# Patient Record
Sex: Male | Born: 1950
Health system: Southern US, Community
[De-identification: ages and names within clinical notes are randomized; demographics above are authoritative.]

## PROBLEM LIST (undated history)

## (undated) DIAGNOSIS — T8859XA Other complications of anesthesia, initial encounter: Secondary | ICD-10-CM

## (undated) DIAGNOSIS — K219 Gastro-esophageal reflux disease without esophagitis: Secondary | ICD-10-CM

## (undated) DIAGNOSIS — E785 Hyperlipidemia, unspecified: Secondary | ICD-10-CM

## (undated) DIAGNOSIS — E119 Type 2 diabetes mellitus without complications: Secondary | ICD-10-CM

## (undated) DIAGNOSIS — J302 Other seasonal allergic rhinitis: Secondary | ICD-10-CM

## (undated) DIAGNOSIS — Z8739 Personal history of other diseases of the musculoskeletal system and connective tissue: Secondary | ICD-10-CM

## (undated) DIAGNOSIS — N400 Enlarged prostate without lower urinary tract symptoms: Secondary | ICD-10-CM

## (undated) DIAGNOSIS — M1990 Unspecified osteoarthritis, unspecified site: Secondary | ICD-10-CM

## (undated) DIAGNOSIS — I480 Paroxysmal atrial fibrillation: Secondary | ICD-10-CM

## (undated) DIAGNOSIS — F419 Anxiety disorder, unspecified: Secondary | ICD-10-CM

## (undated) DIAGNOSIS — G4730 Sleep apnea, unspecified: Secondary | ICD-10-CM

## (undated) DIAGNOSIS — T4145XA Adverse effect of unspecified anesthetic, initial encounter: Secondary | ICD-10-CM

## (undated) DIAGNOSIS — I499 Cardiac arrhythmia, unspecified: Secondary | ICD-10-CM

## (undated) HISTORY — DX: Benign prostatic hyperplasia without lower urinary tract symptoms: N40.0

## (undated) HISTORY — DX: Paroxysmal atrial fibrillation: I48.0

## (undated) HISTORY — DX: Hyperlipidemia, unspecified: E78.5

## (undated) HISTORY — PX: KNEE ARTHROSCOPY: SUR90

---

## 2003-02-18 ENCOUNTER — Ambulatory Visit (HOSPITAL_COMMUNITY): Admission: RE | Admit: 2003-02-18 | Discharge: 2003-02-18 | Payer: Self-pay | Admitting: Internal Medicine

## 2005-03-30 ENCOUNTER — Ambulatory Visit: Payer: Self-pay | Admitting: Orthopedic Surgery

## 2013-02-17 ENCOUNTER — Telehealth: Payer: Self-pay

## 2013-02-17 NOTE — Telephone Encounter (Signed)
Pt is wanting to get set up for his TCS on Oct.27. He is going to call back tomorrow after 3:30

## 2013-02-19 ENCOUNTER — Other Ambulatory Visit: Payer: Self-pay

## 2013-02-19 DIAGNOSIS — Z1211 Encounter for screening for malignant neoplasm of colon: Secondary | ICD-10-CM

## 2013-02-25 NOTE — Telephone Encounter (Signed)
Gastroenterology Pre-Procedure Review  Request Date: 02/20/2013 Requesting Physician: Received a letter time to schedule next/ Last TCS with RMR 02/18/2003  PATIENT REVIEW QUESTIONS: The patient responded to the following health history questions as indicated:    1. Diabetes Melitis: no 2. Joint replacements in the past 12 months: no 3. Major health problems in the past 3 months: no 4. Has an artificial valve or MVP: no 5. Has a defibrillator: no 6. Has been advised in past to take antibiotics in advance of a procedure like teeth cleaning: no    MEDICATIONS & ALLERGIES:    Patient reports the following regarding taking any blood thinners:   Plavix? no Aspirin? no Coumadin? no  Patient confirms/reports the following medications:  Current Outpatient Prescriptions  Medication Sig Dispense Refill  . acetaminophen (TYLENOL) 325 MG tablet Take 650 mg by mouth every 6 (six) hours as needed for pain. Only as needed      . NON FORMULARY Allergy pill    Only as needed       No current facility-administered medications for this visit.    Patient confirms/reports the following allergies:  Allergies  Allergen Reactions  . Codeine Rash    No orders of the defined types were placed in this encounter.    AUTHORIZATION INFORMATION Primary Insurance:   ID #:  Group #:  Pre-Cert / Auth required:  Pre-Cert / Auth #:   Secondary Insurance:   ID #:   Group #:  Pre-Cert / Auth :  Pre-Cert / Auth #:   SCHEDULE INFORMATION: Procedure has been scheduled as follows:  Date:04/07/2013        Time:  7:30 AM Location: [facility] Short Stay  This Gastroenterology Pre-Precedure Review Form is being routed to the following provider(s): R. Roetta Sessions, MD

## 2013-02-25 NOTE — Telephone Encounter (Signed)
Appropriate.

## 2013-02-26 MED ORDER — PEG-KCL-NACL-NASULF-NA ASC-C 100 G PO SOLR: 1.0000 | ORAL | Status: DC

## 2013-02-26 NOTE — Telephone Encounter (Signed)
Rx sent to the pharmacy and instructions mailed to pt.  

## 2013-03-21 ENCOUNTER — Encounter (HOSPITAL_COMMUNITY): Payer: Self-pay | Admitting: Pharmacy Technician

## 2013-04-07 ENCOUNTER — Encounter (HOSPITAL_COMMUNITY): Payer: Self-pay

## 2013-04-07 ENCOUNTER — Ambulatory Visit (HOSPITAL_COMMUNITY)
Admission: RE | Admit: 2013-04-07 | Discharge: 2013-04-07 | Disposition: A | Discharge status: Home / Self Care | Payer: BC Managed Care – PPO | Source: Ambulatory Visit | Attending: Internal Medicine | Admitting: Internal Medicine

## 2013-04-07 ENCOUNTER — Encounter (HOSPITAL_COMMUNITY)
Admission: RE | Disposition: A | Discharge status: Home / Self Care | Payer: Self-pay | Source: Ambulatory Visit | Attending: Internal Medicine

## 2013-04-07 DIAGNOSIS — D126 Benign neoplasm of colon, unspecified: Secondary | ICD-10-CM

## 2013-04-07 DIAGNOSIS — K620 Anal polyp: Secondary | ICD-10-CM

## 2013-04-07 DIAGNOSIS — Z1211 Encounter for screening for malignant neoplasm of colon: Secondary | ICD-10-CM

## 2013-04-07 DIAGNOSIS — K621 Rectal polyp: Secondary | ICD-10-CM

## 2013-04-07 DIAGNOSIS — D128 Benign neoplasm of rectum: Secondary | ICD-10-CM | POA: Insufficient documentation

## 2013-04-07 DIAGNOSIS — K5730 Diverticulosis of large intestine without perforation or abscess without bleeding: Secondary | ICD-10-CM | POA: Insufficient documentation

## 2013-04-07 HISTORY — DX: Adverse effect of unspecified anesthetic, initial encounter: T41.45XA

## 2013-04-07 HISTORY — DX: Other complications of anesthesia, initial encounter: T88.59XA

## 2013-04-07 HISTORY — PX: COLONOSCOPY: SHX5424

## 2013-04-07 HISTORY — DX: Personal history of other diseases of the musculoskeletal system and connective tissue: Z87.39

## 2013-04-07 SURGERY — COLONOSCOPY
Anesthesia: Moderate Sedation

## 2013-04-07 MED ORDER — MIDAZOLAM HCL 5 MG/5ML IJ SOLN
INTRAMUSCULAR | Status: DC | PRN
  Administered 2013-04-07 (×3): 1 mg via INTRAVENOUS
  Administered 2013-04-07: 2 mg via INTRAVENOUS

## 2013-04-07 MED ORDER — MEPERIDINE HCL 100 MG/ML IJ SOLN
INTRAMUSCULAR | Status: AC
  Filled 2013-04-07: qty 2

## 2013-04-07 MED ORDER — ONDANSETRON HCL 4 MG/2ML IJ SOLN
INTRAMUSCULAR | Status: AC
  Filled 2013-04-07: qty 2

## 2013-04-07 MED ORDER — MEPERIDINE HCL 100 MG/ML IJ SOLN
INTRAMUSCULAR | Status: DC | PRN
  Administered 2013-04-07: 25 mg via INTRAVENOUS
  Administered 2013-04-07: 50 mg via INTRAVENOUS
  Administered 2013-04-07: 25 mg via INTRAVENOUS

## 2013-04-07 MED ORDER — SODIUM CHLORIDE 0.9 % IV SOLN
INTRAVENOUS | Status: DC
  Administered 2013-04-07: 07:00:00 via INTRAVENOUS

## 2013-04-07 MED ORDER — SIMETHICONE 40 MG/0.6ML PO SUSP
ORAL | Status: AC
  Filled 2013-04-07: qty 1.2

## 2013-04-07 MED ORDER — STERILE WATER FOR IRRIGATION IR SOLN
Status: DC | PRN
  Administered 2013-04-07: 08:00:00

## 2013-04-07 MED ORDER — MIDAZOLAM HCL 5 MG/5ML IJ SOLN
INTRAMUSCULAR | Status: AC
  Filled 2013-04-07: qty 10

## 2013-04-07 MED ORDER — ONDANSETRON HCL 4 MG/2ML IJ SOLN
INTRAMUSCULAR | Status: DC | PRN
  Administered 2013-04-07: 4 mg via INTRAVENOUS

## 2013-04-07 NOTE — H&P (Signed)
  Primary Care Physician:  Selinda Flavin, MD Primary Gastroenterologist:  Dr. Jena Gauss  Pre-Procedure History & Physical: HPI:  Brian Leach is a 62 y.o. male is here for a screening colonoscopy. Negative colonoscopy 10 years ago. No bowel symptoms. No family history colon polyps or cancer.  Past Medical History  Diagnosis Date  . Complication of anesthesia     difficult intubation  . History of gout     Past Surgical History  Procedure Laterality Date  . Knee arthroscopy Left     Prior to Admission medications   Medication Sig Start Date End Date Taking? Authorizing Provider  OVER THE COUNTER MEDICATION Take 1 tablet by mouth daily as needed. OTC allergy medication   Yes Historical Provider, MD  ibuprofen (ADVIL,MOTRIN) 200 MG tablet Take 800 mg by mouth every 6 (six) hours as needed for pain.    Historical Provider, MD    Allergies as of 02/19/2013  . (Not on File)    No family history on file.  History   Social History  . Marital Status: Single    Spouse Name: N/A    Number of Children: N/A  . Years of Education: N/A   Occupational History  . Not on file.   Social History Main Topics  . Smoking status: Never Smoker   . Smokeless tobacco: Not on file  . Alcohol Use: No  . Drug Use: No  . Sexual Activity: Yes    Birth Control/ Protection: None   Other Topics Concern  . Not on file   Social History Narrative  . No narrative on file    Review of Systems: See HPI, otherwise negative ROS  Physical Exam: BP 130/87  Pulse 62  Temp(Src) 98.2 F (36.8 C) (Oral)  Resp 16  Ht 5' 7.5" (1.715 m)  Wt 212 lb (96.163 kg)  BMI 32.69 kg/m2  SpO2 98% General:   Alert,  Well-developed, well-nourished, pleasant and cooperative in NAD Head:  Normocephalic and atraumatic. Eyes:  Sclera clear, no icterus.   Conjunctiva pink. Ears:  Normal auditory acuity. Nose:  No deformity, discharge,  or lesions. Mouth:  No deformity or lesions, dentition normal. Neck:  Supple;  no masses or thyromegaly. Lungs:  Clear throughout to auscultation.   No wheezes, crackles, or rhonchi. No acute distress. Heart:  Regular rate and rhythm; no murmurs, clicks, rubs,  or gallops. Abdomen:  Soft, nontender and nondistended. No masses, hepatosplenomegaly or hernias noted. Normal bowel sounds, without guarding, and without rebound.   Msk:  Symmetrical without gross deformities. Normal posture. Pulses:  Normal pulses noted. Extremities:  Without clubbing or edema. Neurologic:  Alert and  oriented x4;  grossly normal neurologically.  Impression/Plan: [name] is now here to undergo a screening colonoscopy. Average risk risk screening examination.  Risks, benefits, limitations, imponderables and alternatives regarding colonoscopy have been reviewed with the patient. Questions have been answered. All parties agreeable.

## 2013-04-07 NOTE — Op Note (Signed)
[facility] [address] [place], [address]   COLONOSCOPY PROCEDURE REPORT  PATIENT: Brian Leach, Brian Leach.  MR#:         [id number] BIRTHDATE: Oct 27, 1950 , 62  yrs. old GENDER: Male ENDOSCOPIST: R.  Roetta Sessions, MD FACP FACG REFERRED BY:  Selinda Flavin, M.D. PROCEDURE DATE:  04/07/2013 PROCEDURE:     Colonoscopy with biopsy and snare polypectomy  INDICATIONS: Average risk colorectal cancer screening examination  INFORMED CONSENT:  The risks, benefits, alternatives and imponderables including but not limited to bleeding, perforation as well as the possibility of a missed lesion have been reviewed.  The potential for biopsy, lesion removal, etc. have also been discussed.  Questions have been answered.  All parties agreeable. Please see the history and physical in the medical record for more information.  MEDICATIONS: Versed 6 mg IV and Demerol 100 mg IV in divided doses. Zofran 4 mg  DESCRIPTION OF PROCEDURE:  After a digital rectal exam was performed, the EC-[id number] ([id number])  colonoscope was advanced from the anus through the rectum and colon to the area of the cecum, ileocecal valve and appendiceal orifice.  The cecum was deeply intubated.  These structures were well-seen and photographed for the record.  From the level of the cecum and ileocecal valve, the scope was slowly and cautiously withdrawn.  The mucosal surfaces were carefully surveyed utilizing scope tip deflection to facilitate fold flattening as needed.  The scope was pulled down into the rectum where a thorough examination including retroflexion was performed.    FINDINGS:  Adequate Preparation.  (1) 4 mm polyp in the rectum at 3 cm verge; otherwise, the remainder of the rectal mucosa appeared normal. Scattered sigmoid diverticula; (1) 3 mm polyp at the rectosigmoid junction. There was another 5 mm polyp at the splenic flexure. At the ileocecal valve, there was an 8 mm polyp. The remainder of  the colonic mucosa appeared normal.  THERAPEUTIC / DIAGNOSTIC MANEUVERS PERFORMED:  The above-mentioned polyps were hot snare removed - except for the rectosigmoid polyp which was cold biopsied/removed.;  COMPLICATIONS: None  CECAL WITHDRAWAL TIME:  IMPRESSION:  Colonic diverticulosis. Multiple colonic and rectal polyps-removed as described above  RECOMMENDATIONS: Followup on pathology.   _______________________________ eSigned:  R. Roetta Sessions, MD FACP [facility] 04/07/2013 8:42 AM   CC:    PATIENT NAME:  Issaac, Shipper. MR#: [id number]

## 2013-04-09 ENCOUNTER — Encounter: Payer: Self-pay | Admitting: Internal Medicine

## 2013-04-11 ENCOUNTER — Encounter (HOSPITAL_COMMUNITY): Payer: Self-pay | Admitting: Internal Medicine

## 2017-02-09 DIAGNOSIS — E7800 Pure hypercholesterolemia, unspecified: Secondary | ICD-10-CM | POA: Diagnosis not present

## 2017-02-09 DIAGNOSIS — M129 Arthropathy, unspecified: Secondary | ICD-10-CM | POA: Diagnosis not present

## 2017-02-09 DIAGNOSIS — R739 Hyperglycemia, unspecified: Secondary | ICD-10-CM | POA: Diagnosis not present

## 2017-02-14 DIAGNOSIS — Z6836 Body mass index (BMI) 36.0-36.9, adult: Secondary | ICD-10-CM | POA: Diagnosis not present

## 2017-02-14 DIAGNOSIS — Z23 Encounter for immunization: Secondary | ICD-10-CM | POA: Diagnosis not present

## 2017-02-14 DIAGNOSIS — Z0001 Encounter for general adult medical examination with abnormal findings: Secondary | ICD-10-CM | POA: Diagnosis not present

## 2017-02-14 DIAGNOSIS — M129 Arthropathy, unspecified: Secondary | ICD-10-CM | POA: Diagnosis not present

## 2017-02-14 DIAGNOSIS — M545 Low back pain: Secondary | ICD-10-CM | POA: Diagnosis not present

## 2017-06-25 DIAGNOSIS — Z6836 Body mass index (BMI) 36.0-36.9, adult: Secondary | ICD-10-CM | POA: Diagnosis not present

## 2017-06-25 DIAGNOSIS — J069 Acute upper respiratory infection, unspecified: Secondary | ICD-10-CM | POA: Diagnosis not present

## 2017-07-19 DIAGNOSIS — H1131 Conjunctival hemorrhage, right eye: Secondary | ICD-10-CM | POA: Diagnosis not present

## 2017-07-19 DIAGNOSIS — S0531XA Ocular laceration without prolapse or loss of intraocular tissue, right eye, initial encounter: Secondary | ICD-10-CM | POA: Diagnosis not present

## 2017-08-10 DIAGNOSIS — H524 Presbyopia: Secondary | ICD-10-CM | POA: Diagnosis not present

## 2017-08-20 DIAGNOSIS — R739 Hyperglycemia, unspecified: Secondary | ICD-10-CM | POA: Diagnosis not present

## 2017-08-20 DIAGNOSIS — Z6836 Body mass index (BMI) 36.0-36.9, adult: Secondary | ICD-10-CM | POA: Diagnosis not present

## 2017-08-20 DIAGNOSIS — E7800 Pure hypercholesterolemia, unspecified: Secondary | ICD-10-CM | POA: Diagnosis not present

## 2017-08-20 DIAGNOSIS — M545 Low back pain: Secondary | ICD-10-CM | POA: Diagnosis not present

## 2017-08-20 DIAGNOSIS — M129 Arthropathy, unspecified: Secondary | ICD-10-CM | POA: Diagnosis not present

## 2017-10-01 DIAGNOSIS — Z6835 Body mass index (BMI) 35.0-35.9, adult: Secondary | ICD-10-CM | POA: Diagnosis not present

## 2017-10-01 DIAGNOSIS — M7551 Bursitis of right shoulder: Secondary | ICD-10-CM | POA: Diagnosis not present

## 2017-10-08 DIAGNOSIS — R300 Dysuria: Secondary | ICD-10-CM | POA: Diagnosis not present

## 2017-10-08 DIAGNOSIS — Z6835 Body mass index (BMI) 35.0-35.9, adult: Secondary | ICD-10-CM | POA: Diagnosis not present

## 2017-10-08 DIAGNOSIS — M7551 Bursitis of right shoulder: Secondary | ICD-10-CM | POA: Diagnosis not present

## 2017-10-15 DIAGNOSIS — Z6836 Body mass index (BMI) 36.0-36.9, adult: Secondary | ICD-10-CM | POA: Diagnosis not present

## 2017-10-15 DIAGNOSIS — S80862A Insect bite (nonvenomous), left lower leg, initial encounter: Secondary | ICD-10-CM | POA: Diagnosis not present

## 2017-10-29 DIAGNOSIS — R300 Dysuria: Secondary | ICD-10-CM | POA: Diagnosis not present

## 2017-10-29 DIAGNOSIS — Z1331 Encounter for screening for depression: Secondary | ICD-10-CM | POA: Diagnosis not present

## 2017-10-29 DIAGNOSIS — Z1389 Encounter for screening for other disorder: Secondary | ICD-10-CM | POA: Diagnosis not present

## 2017-10-29 DIAGNOSIS — M7551 Bursitis of right shoulder: Secondary | ICD-10-CM | POA: Diagnosis not present

## 2017-10-29 DIAGNOSIS — S76211A Strain of adductor muscle, fascia and tendon of right thigh, initial encounter: Secondary | ICD-10-CM | POA: Diagnosis not present

## 2017-10-29 DIAGNOSIS — Z6836 Body mass index (BMI) 36.0-36.9, adult: Secondary | ICD-10-CM | POA: Diagnosis not present

## 2017-11-20 DIAGNOSIS — M7551 Bursitis of right shoulder: Secondary | ICD-10-CM | POA: Diagnosis not present

## 2017-11-20 DIAGNOSIS — Z6837 Body mass index (BMI) 37.0-37.9, adult: Secondary | ICD-10-CM | POA: Diagnosis not present

## 2017-11-20 DIAGNOSIS — M7521 Bicipital tendinitis, right shoulder: Secondary | ICD-10-CM | POA: Diagnosis not present

## 2017-12-05 DIAGNOSIS — M7521 Bicipital tendinitis, right shoulder: Secondary | ICD-10-CM | POA: Diagnosis not present

## 2017-12-05 DIAGNOSIS — Z6837 Body mass index (BMI) 37.0-37.9, adult: Secondary | ICD-10-CM | POA: Diagnosis not present

## 2017-12-05 DIAGNOSIS — M7551 Bursitis of right shoulder: Secondary | ICD-10-CM | POA: Diagnosis not present

## 2018-01-30 ENCOUNTER — Encounter: Payer: Self-pay | Admitting: Internal Medicine

## 2018-02-15 DIAGNOSIS — N400 Enlarged prostate without lower urinary tract symptoms: Secondary | ICD-10-CM | POA: Diagnosis not present

## 2018-02-15 DIAGNOSIS — R5383 Other fatigue: Secondary | ICD-10-CM | POA: Diagnosis not present

## 2018-02-15 DIAGNOSIS — R739 Hyperglycemia, unspecified: Secondary | ICD-10-CM | POA: Diagnosis not present

## 2018-02-15 DIAGNOSIS — E7800 Pure hypercholesterolemia, unspecified: Secondary | ICD-10-CM | POA: Diagnosis not present

## 2018-02-18 DIAGNOSIS — Z6836 Body mass index (BMI) 36.0-36.9, adult: Secondary | ICD-10-CM | POA: Diagnosis not present

## 2018-02-18 DIAGNOSIS — Z23 Encounter for immunization: Secondary | ICD-10-CM | POA: Diagnosis not present

## 2018-02-18 DIAGNOSIS — Z0000 Encounter for general adult medical examination without abnormal findings: Secondary | ICD-10-CM | POA: Diagnosis not present

## 2018-03-12 ENCOUNTER — Ambulatory Visit (INDEPENDENT_AMBULATORY_CARE_PROVIDER_SITE_OTHER): Payer: Self-pay

## 2018-03-12 ENCOUNTER — Ambulatory Visit: Payer: Self-pay

## 2018-03-12 DIAGNOSIS — Z86010 Personal history of colonic polyps: Secondary | ICD-10-CM

## 2018-03-12 MED ORDER — NA SULFATE-K SULFATE-MG SULF 17.5-3.13-1.6 GM/177ML PO SOLN: 1.0000 | ORAL | 0 refills | Status: DC

## 2018-03-12 NOTE — Progress Notes (Signed)
Gastroenterology Pre-Procedure Review  Request Date:03/12/18 Requesting Physician: 5 year recall- last tcs 03/13/18 RMR- tubular adenoma  PATIENT REVIEW QUESTIONS: The patient responded to the following health history questions as indicated:    1. Diabetes Melitis: no 2. Joint replacements in the past 12 months: no 3. Major health problems in the past 3 months: no 4. Has an artificial valve or MVP: no 5. Has a defibrillator: no 6. Has been advised in past to take antibiotics in advance of a procedure like teeth cleaning: no 7. Family history of colon cancer: no  8. Alcohol Use: no 9. History of sleep apnea: no  10. History of coronary artery or other vascular stents placed within the last 12 months: no 11. History of any prior anesthesia complications: no    MEDICATIONS & ALLERGIES:    Patient reports the following regarding taking any blood thinners:   Plavix? no Aspirin? no Coumadin? no Brilinta? no Xarelto? no Eliquis? no Pradaxa? no Savaysa? no Effient? no  Patient confirms/reports the following medications:  Current Outpatient Medications  Medication Sig Dispense Refill  . ibuprofen (ADVIL,MOTRIN) 800 MG tablet Take 800 mg by mouth every 6 (six) hours as needed.   0   No current facility-administered medications for this visit.     Patient confirms/reports the following allergies:  Allergies  Allergen Reactions  . Codeine Rash    No orders of the defined types were placed in this encounter.   AUTHORIZATION INFORMATION Primary Insurance:[employer] ,  ID #: [id number] Pre-Cert / Josem Kaufmann required: no  SCHEDULE INFORMATION: Procedure has been scheduled as follows:  Date: 05/14/18, Time:10:30 Location: [facility] Dr. Gala Romney  This Gastroenterology Pre-Precedure Review Form is being routed to the following provider(s): Walden Field NP

## 2018-03-12 NOTE — Patient Instructions (Signed)
Brian Leach  1950/12/30 MRN: [id number]     Procedure Date: 05/14/18 Time to register: 9:30am Place to register: [facility] Procedure Time: 10:30am Scheduled provider: R. Garfield Cornea, MD    PREPARATION FOR COLONOSCOPY WITH SUPREP BOWEL PREP KIT  Note: Suprep Bowel Prep Kit is a split-dose (2day) regimen. Consumption of BOTH 6-ounce bottles is required for a complete prep.  Please notify us immediately if you are diabetic, take iron supplements, or if you are on Coumadin or any other blood thinners.                                                                                                                                                 2 DAYS BEFORE PROCEDURE:  DATE: 05/12/18   DAY: Sunday Begin clear liquid diet AFTER your lunch meal. NO SOLID FOODS after this point.  1 DAY BEFORE PROCEDURE:  DATE: 05/13/18   DAY: Monday Continue clear liquids the entire day - NO SOLID FOOD.    At 6:00pm: Complete steps 1 through 4 below, using ONE (1) 6-ounce bottle, before going to bed. Step 1:  Pour ONE (1) 6-ounce bottle of SUPREP liquid into the mixing container.  Step 2:  Add cool drinking water to the 16 ounce line on the container and mix.  Note: Dilute the solution concentrate as directed prior to use. Step 3:  DRINK ALL the liquid in the container. Step 4:  You MUST drink an additional two (2) or more 16 ounce containers of water over the next one (1) hour.   Continue clear liquids.  DAY OF PROCEDURE:   DATE: 05/14/18   DAY: Tuesday If you take medications for your heart, blood pressure, or breathing, you may take these medications.    5 hours before your procedure at :5:30am Step 1:  Pour ONE (1) 6-ounce bottle of SUPREP liquid into the mixing container.  Step 2:  Add cool drinking water to the 16 ounce line on the container and mix.  Note: Dilute the solution concentrate as directed prior to use. Step 3:  DRINK ALL the liquid in the container. Step 4:  You MUST  drink an additional two (2) or more 16 ounce containers of water over the next one (1) hour. You MUST complete the final glass of water at least 3 hours before your colonoscopy.   Nothing by mouth past 7:30am  You may take your morning medications with sip of water unless we have instructed otherwise.    Please see below for Dietary Information.  CLEAR LIQUIDS INCLUDE:  Water Jello (NOT red in color)   Ice Popsicles (NOT red in color)   Tea (sugar ok, no milk/cream) Powdered fruit flavored drinks  Coffee (sugar ok, no milk/cream) Gatorade/ Lemonade/ Kool-Aid  (NOT red in color)   Juice: apple, white grape, white cranberry Soft drinks  Clear bullion, consomme, broth (fat free beef/chicken/vegetable)  Carbonated beverages (any kind)  Strained chicken noodle soup Hard Candy   Remember: Clear liquids are liquids that will allow you to see your fingers on the other side of a clear glass. Be sure liquids are NOT red in color, and not cloudy, but CLEAR.  DO NOT EAT OR DRINK ANY OF THE FOLLOWING:  Dairy products of any kind   Cranberry juice Tomato juice / V8 juice   Grapefruit juice Orange juice     Red grape juice  Do not eat any solid foods, including such foods as: cereal, oatmeal, yogurt, fruits, vegetables, creamed soups, eggs, bread, crackers, pureed foods in a blender, etc.   HELPFUL HINTS FOR DRINKING PREP SOLUTION:   Make sure prep is extremely cold. Mix and refrigerate the the morning of the prep. You may also put in the freezer.   You may try mixing some Crystal Light or Country Time Lemonade if you prefer. Mix in small amounts; add more if necessary.  Try drinking through a straw  Rinse mouth with water or a mouthwash between glasses, to remove after-taste.  Try sipping on a cold beverage /ice/ popsicles between glasses of prep.  Place a piece of sugar-free hard candy in mouth between glasses.  If you become nauseated, try consuming smaller amounts, or stretch out the  time between glasses. Stop for 30-60 minutes, then slowly start back drinking.     OTHER INSTRUCTIONS  You will need a responsible adult at least 66 years of age to accompany you and drive you home. This person must remain in the waiting room during your procedure. The hospital will cancel your procedure if you do not have a responsible adult with you.   1. Wear loose fitting clothing that is easily removed. 2. Leave jewelry and other valuables at home.  3. Remove all body piercing jewelry and leave at home. 4. Total time from sign-in until discharge is approximately 2-3 hours. 5. You should go home directly after your procedure and rest. You can resume normal activities the day after your procedure. 6. The day of your procedure you should not:  Drive  Make legal decisions  Operate machinery  Drink alcohol  Return to work   You may call the office (Dept: [phone]) before 5:00pm, or page the doctor on call [phone]) after 5:00pm, for further instructions, if necessary.   Insurance Information YOU WILL NEED TO CHECK WITH YOUR INSURANCE COMPANY FOR THE BENEFITS OF COVERAGE YOU HAVE FOR THIS PROCEDURE.  UNFORTUNATELY, NOT ALL INSURANCE COMPANIES HAVE BENEFITS TO COVER ALL OR PART OF THESE TYPES OF PROCEDURES.  IT IS YOUR RESPONSIBILITY TO CHECK YOUR BENEFITS, HOWEVER, WE WILL BE GLAD TO ASSIST YOU WITH ANY CODES YOUR INSURANCE COMPANY MAY NEED.    PLEASE NOTE THAT MOST INSURANCE COMPANIES WILL NOT COVER A SCREENING COLONOSCOPY FOR PEOPLE UNDER THE AGE OF 50  IF YOU HAVE BCBS INSURANCE, YOU MAY HAVE BENEFITS FOR A SCREENING COLONOSCOPY BUT IF POLYPS ARE FOUND THE DIAGNOSIS WILL CHANGE AND THEN YOU MAY HAVE A DEDUCTIBLE THAT WILL NEED TO BE MET. SO PLEASE MAKE SURE YOU CHECK YOUR BENEFITS FOR A SCREENING COLONOSCOPY AS WELL AS A DIAGNOSTIC COLONOSCOPY.

## 2018-03-15 NOTE — Progress Notes (Signed)
Ok to schedule.

## 2018-03-18 DIAGNOSIS — M779 Enthesopathy, unspecified: Secondary | ICD-10-CM | POA: Diagnosis not present

## 2018-03-18 DIAGNOSIS — Z6836 Body mass index (BMI) 36.0-36.9, adult: Secondary | ICD-10-CM | POA: Diagnosis not present

## 2018-05-14 ENCOUNTER — Encounter (HOSPITAL_COMMUNITY): Payer: Self-pay | Admitting: *Deleted

## 2018-05-14 ENCOUNTER — Ambulatory Visit (HOSPITAL_COMMUNITY)
Admission: RE | Admit: 2018-05-14 | Discharge: 2018-05-14 | Disposition: A | Discharge status: Home / Self Care | Payer: PPO | Source: Ambulatory Visit | Attending: Internal Medicine | Admitting: Internal Medicine

## 2018-05-14 ENCOUNTER — Encounter (HOSPITAL_COMMUNITY)
Admission: RE | Disposition: A | Discharge status: Home / Self Care | Payer: Self-pay | Source: Ambulatory Visit | Attending: Internal Medicine

## 2018-05-14 ENCOUNTER — Other Ambulatory Visit: Payer: Self-pay

## 2018-05-14 DIAGNOSIS — Z885 Allergy status to narcotic agent status: Secondary | ICD-10-CM | POA: Diagnosis not present

## 2018-05-14 DIAGNOSIS — Z1211 Encounter for screening for malignant neoplasm of colon: Secondary | ICD-10-CM | POA: Diagnosis not present

## 2018-05-14 DIAGNOSIS — D123 Benign neoplasm of transverse colon: Secondary | ICD-10-CM | POA: Insufficient documentation

## 2018-05-14 DIAGNOSIS — K621 Rectal polyp: Secondary | ICD-10-CM | POA: Insufficient documentation

## 2018-05-14 DIAGNOSIS — Z79899 Other long term (current) drug therapy: Secondary | ICD-10-CM | POA: Insufficient documentation

## 2018-05-14 DIAGNOSIS — Z86010 Personal history of colonic polyps: Secondary | ICD-10-CM | POA: Insufficient documentation

## 2018-05-14 DIAGNOSIS — M109 Gout, unspecified: Secondary | ICD-10-CM | POA: Insufficient documentation

## 2018-05-14 HISTORY — PX: POLYPECTOMY: SHX5525

## 2018-05-14 HISTORY — PX: COLONOSCOPY: SHX5424

## 2018-05-14 SURGERY — COLONOSCOPY
Anesthesia: Moderate Sedation

## 2018-05-14 MED ORDER — MIDAZOLAM HCL 5 MG/5ML IJ SOLN
INTRAMUSCULAR | Status: AC
  Filled 2018-05-14: qty 10

## 2018-05-14 MED ORDER — ONDANSETRON HCL 4 MG/2ML IJ SOLN
INTRAMUSCULAR | Status: AC
  Filled 2018-05-14: qty 2

## 2018-05-14 MED ORDER — MEPERIDINE HCL 100 MG/ML IJ SOLN
INTRAMUSCULAR | Status: DC | PRN
  Administered 2018-05-14 (×2): 25 mg

## 2018-05-14 MED ORDER — ONDANSETRON HCL 4 MG/2ML IJ SOLN
INTRAMUSCULAR | Status: DC | PRN
  Administered 2018-05-14: 4 mg via INTRAVENOUS

## 2018-05-14 MED ORDER — STERILE WATER FOR IRRIGATION IR SOLN
Status: DC | PRN
  Administered 2018-05-14: 1.5 mL

## 2018-05-14 MED ORDER — SODIUM CHLORIDE 0.9 % IV SOLN
INTRAVENOUS | Status: DC
  Administered 2018-05-14: 10:00:00 via INTRAVENOUS

## 2018-05-14 MED ORDER — MEPERIDINE HCL 50 MG/ML IJ SOLN
INTRAMUSCULAR | Status: AC
  Filled 2018-05-14: qty 1

## 2018-05-14 MED ORDER — MIDAZOLAM HCL 5 MG/5ML IJ SOLN
INTRAMUSCULAR | Status: DC | PRN
  Administered 2018-05-14 (×6): 1 mg via INTRAVENOUS
  Administered 2018-05-14 (×2): 2 mg via INTRAVENOUS
  Administered 2018-05-14: 1 mg via INTRAVENOUS

## 2018-05-14 MED ORDER — MIDAZOLAM HCL 5 MG/5ML IJ SOLN
INTRAMUSCULAR | Status: AC
  Filled 2018-05-14: qty 5

## 2018-05-14 NOTE — Op Note (Signed)
[facility] Patient Name: Brian Leach Procedure Date: 05/14/2018 10:14 AM MRN: [id number] Date of Birth: [birth year] Attending MD: Norvel Richards , MD CSN: [id number] Age: 67 Admit Type: Outpatient Procedure:                Colonoscopy Indications:              High risk colon cancer surveillance: Personal                            history of colonic polyps Providers:                Norvel Richards, MD, Lurline Del, RN, Gerome Sam, RN, Randa Spike, Technician Referring MD:              Medicines:                Midazolam 11 mg IV, Meperidine 50 mg IV,                            Ondansetron 4 mg IV Complications:            No immediate complications. Estimated Blood Loss:     Estimated blood loss was minimal. Procedure:                Pre-Anesthesia Assessment:                           - Prior to the procedure, a History and Physical                            was performed, and patient medications and                            allergies were reviewed. The patient's tolerance of                            previous anesthesia was also reviewed. The risks                            and benefits of the procedure and the sedation                            options and risks were discussed with the patient.                            All questions were answered, and informed consent                            was obtained. Prior Anticoagulants: The patient has                            taken no previous anticoagulant or antiplatelet  agents. ASA Grade Assessment: II - A patient with                            mild systemic disease. After reviewing the risks                            and benefits, the patient was deemed in                            satisfactory condition to undergo the procedure.                           After obtaining informed consent, the colonoscope                            was passed  under direct vision. Throughout the                            procedure, the patient's blood pressure, pulse, and                            oxygen saturations were monitored continuously. The                            [id number]-[id number] ([id number]) scope was introduced through                            the anus and advanced to the the cecum, identified                            by appendiceal orifice and ileocecal valve. The                            colonoscopy was performed without difficulty. The                            patient tolerated the procedure well. The quality                            of the bowel preparation was adequate. The                            ileocecal valve, appendiceal orifice, and rectum                            were photographed. The entire colon was well                            visualized. Scope In: 10:39:41 AM Scope Out: 11:14:23 AM Scope Withdrawal Time: 0 hours 20 minutes 16 seconds  Total Procedure Duration: 0 hours 34 minutes 42 seconds  Findings:      The perianal and digital rectal examinations were normal. Rere       left-sided diverticula      Three (  8-32mm)polyps were found in the hepatic flexure. These polyps       were removed with a hot snare. Resection and retrieval were complete.       Estimated blood loss: none.      A 3 mm polyp was found in the rectum. The polyp was sessile. The polyp       was removed with a cold snare. Resection and retrieval were complete.       Estimated blood loss was minimal. Estimated blood loss was minimal.      The exam was otherwise without abnormality on direct and retroflexion       views. Impression:               - Three polyps at the hepatic flexure, removed with                            a hot snare. Resected and retrieved.                           - One 3 mm polyp in the rectum, removed with a cold                            snare. Resected and retrieved. Minimal                             diverticulosis.                           - The examination was otherwise normal on direct                            and retroflexion views. Moderate Sedation:      Moderate (conscious) sedation was administered by the endoscopy nurse       and supervised by the endoscopist. The following parameters were       monitored: oxygen saturation, heart rate, blood pressure, respiratory       rate, EKG, adequacy of pulmonary ventilation, and response to care.       Total physician intraservice time was 45 minutes. Recommendation:           - Patient has a contact number available for                            emergencies. The signs and symptoms of potential                            delayed complications were discussed with the                            patient. Return to normal activities tomorrow.                            Written discharge instructions were provided to the                            patient.                           -  Advance diet as tolerated.                           - Repeat colonoscopy date to be determined after                            pending pathology results are reviewed for                            surveillance based on pathology results.                           - Return to GI clinic (date not yet determined). Procedure Code(s):        --- Professional ---                           [phone], Colonoscopy, flexible; with removal of                            tumor(s), polyp(s), or other lesion(s) by snare                            technique                           99153, Moderate sedation; each additional 15                            minutes intraservice time                           99153, Moderate sedation; each additional 15                            minutes intraservice time                           G0500, Moderate sedation services provided by the                            same physician or other qualified health care                             professional performing a gastrointestinal                            endoscopic service that sedation supports,                            requiring the presence of an independent trained                            observer to assist in the monitoring of the                            patient's level of consciousness and  physiological                            status; initial 15 minutes of intra-service time;                            patient age 80 years or older (additional time may                            be reported with [phone], as appropriate) Diagnosis Code(s):        --- Professional ---                           Z86.010, Personal history of colonic polyps                           D12.3, Benign neoplasm of transverse colon (hepatic                            flexure or splenic flexure)                           K62.1, Rectal polyp CPT copyright 2018 American Medical Association. All rights reserved. The codes documented in this report are preliminary and upon coder review may  be revised to meet current compliance requirements. Cristopher Estimable. Ireta Pullman, MD Norvel Richards, MD 05/14/2018 11:37:22 AM This report has been signed electronically. Number of Addenda: 0

## 2018-05-14 NOTE — H&P (Signed)
@LOGO @   Primary Care Physician:  Rory Percy, MD Primary Gastroenterologist:  Dr. Gala Romney  Pre-Procedure History & Physical: HPI:  Brian Leach is a 67 y.o. male here for surveillance colonoscopy.  History of multiple colonic adenomas removed 2016.  Past Medical History:  Diagnosis Date  . Complication of anesthesia    difficult intubation  . History of gout     Past Surgical History:  Procedure Laterality Date  . COLONOSCOPY N/A 04/07/2013   Procedure: COLONOSCOPY;  Surgeon: Daneil Dolin, MD;  Location: [facility];  Service: Endoscopy;  Laterality: N/A;  7:30 AM  . KNEE ARTHROSCOPY Left     Prior to Admission medications   Medication Sig Start Date End Date Taking? Authorizing Provider  acetaminophen (TYLENOL) 500 MG tablet Take 500-1,000 mg by mouth every 8 (eight) hours as needed (pain).   Yes [provider]  diphenhydrAMINE (BENADRYL) 25 mg capsule Take 25 mg by mouth daily.   Yes [provider]  sodium chloride (OCEAN) 0.65 % SOLN nasal spray Place 1 spray into both nostrils as needed for congestion.   Yes [provider]    Allergies as of 03/12/2018 - Review Complete 03/12/2018  Allergen Reaction Noted  . Codeine Rash 02/25/2013    History reviewed. No pertinent family history.  Social History   Socioeconomic History  . Marital status: Single    Spouse name: Not on file  . Number of children: Not on file  . Years of education: Not on file  . Highest education level: Not on file  Occupational History  . Not on file  Social Needs  . Financial resource strain: Not on file  . Food insecurity:    Worry: Not on file    Inability: Not on file  . Transportation needs:    Medical: Not on file    Non-medical: Not on file  Tobacco Use  . Smoking status: Never Smoker  Substance and Sexual Activity  . Alcohol use: No  . Drug use: No  . Sexual activity: Yes    Birth control/protection: None  Lifestyle  . Physical  activity:    Days per week: Not on file    Minutes per session: Not on file  . Stress: Not on file  Relationships  . Social connections:    Talks on phone: Not on file    Gets together: Not on file    Attends religious service: Not on file    Active member of club or organization: Not on file    Attends meetings of clubs or organizations: Not on file    Relationship status: Not on file  . Intimate partner violence:    Fear of current or ex partner: Not on file    Emotionally abused: Not on file    Physically abused: Not on file    Forced sexual activity: Not on file  Other Topics Concern  . Not on file  Social History Narrative  . Not on file    Review of Systems: See HPI, otherwise negative ROS  Physical Exam: BP 137/85   Pulse 74   Temp 97.9 F (36.6 C) (Oral)   Resp (!) 22   Ht 5' 7.5" (1.715 m)   Wt 101.2 kg   SpO2 98%   BMI 34.41 kg/m  General:   Alert,  Well-developed, well-nourished, pleasant and cooperative in NAD Mouth:  No deformity or lesions. Neck:  Supple; no masses or thyromegaly. No significant cervical adenopathy. Lungs:  Clear  throughout to auscultation.   No wheezes, crackles, or rhonchi. No acute distress. Heart:  Regular rate and rhythm; no murmurs, clicks, rubs,  or gallops. Abdomen: Non-distended, normal bowel sounds.  Soft and nontender without appreciable mass or hepatosplenomegaly.  Pulses:  Normal pulses noted. Extremities:  Without clubbing or edema.  Impression/Plan: 67 year old gentleman here for surveillance colonoscopy.  History of multiple colonic adenomas.  The risks, benefits, limitations, alternatives and imponderables have been reviewed with the patient. Questions have been answered. All parties are agreeable.      Notice: This dictation was prepared with Dragon dictation along with smaller phrase technology. Any transcriptional errors that result from this process are unintentional and may not be corrected upon review.

## 2018-05-14 NOTE — Discharge Instructions (Signed)
Colon Polyps °Polyps are tissue growths inside the body. Polyps can grow in many places, including the large intestine (colon). A polyp may be a round bump or a mushroom-shaped growth. You could have one polyp or several. °Most colon polyps are noncancerous (benign). However, some colon polyps can become cancerous over time. °What are the causes? °The exact cause of colon polyps is not known. °What increases the risk? °This condition is more likely to develop in people who: °· Have a family history of colon cancer or colon polyps. °· Are older than 50 or older than 45 if they are African American. °· Have inflammatory bowel disease, such as ulcerative colitis or Crohn disease. °· Are overweight. °· Smoke cigarettes. °· Do not get enough exercise. °· Drink too much alcohol. °· Eat a diet that is: °? High in fat and red meat. °? Low in fiber. °· Had childhood cancer that was treated with abdominal radiation. ° °What are the signs or symptoms? °Most polyps do not cause symptoms. If you have symptoms, they may include: °· Blood coming from your rectum when having a bowel movement. °· Blood in your stool. The stool may look dark red or black. °· A change in bowel habits, such as constipation or diarrhea. ° °How is this diagnosed? °This condition is diagnosed with a colonoscopy. This is a procedure that uses a lighted, flexible scope to look at the inside of your colon. °How is this treated? °Treatment for this condition involves removing any polyps that are found. Those polyps will then be tested for cancer. If cancer is found, your health care provider will talk to you about options for colon cancer treatment. °Follow these instructions at home: °Diet °· Eat plenty of fiber, such as fruits, vegetables, and whole grains. °· Eat foods that are high in calcium and vitamin D, such as milk, cheese, yogurt, eggs, liver, fish, and broccoli. °· Limit foods high in fat, red meats, and processed meats, such as hot dogs, sausage,  bacon, and lunch meats. °· Maintain a healthy weight, or lose weight if recommended by your health care provider. °General instructions °· Do not smoke cigarettes. °· Do not drink alcohol excessively. °· Keep all follow-up visits as told by your health care provider. This is important. This includes keeping regularly scheduled colonoscopies. Talk to your health care provider about when you need a colonoscopy. °· Exercise every day or as told by your health care provider. °Contact a health care provider if: °· You have new or worsening bleeding during a bowel movement. °· You have new or increased blood in your stool. °· You have a change in bowel habits. °· You unexpectedly lose weight. °This information is not intended to replace advice given to you by your health care provider. Make sure you discuss any questions you have with your health care provider. °Document Released: 02/23/2004 Document Revised: 11/04/2015 Document Reviewed: 04/19/2015 °Elsevier Interactive Patient Education © 2018 [employer] ° °Colonoscopy °Discharge Instructions ° °Read the instructions outlined below and refer to this sheet in the next few weeks. These discharge instructions provide you with general information on caring for yourself after you leave the hospital. Your doctor may also give you specific instructions. While your treatment has been planned according to the most current medical practices available, unavoidable complications occasionally occur. If you have any problems or questions after discharge, call Dr. Rourk at [phone]. °ACTIVITY °· You may resume your regular activity, but move at a slower pace for the next 24   hours.  °· Take frequent rest periods for the next 24 hours.  °· Walking will help get rid of the air and reduce the bloated feeling in your belly (abdomen).  °· No driving for 24 hours (because of the medicine (anesthesia) used during the test).   °· Do not sign any important legal documents or operate any  machinery for 24 hours (because of the anesthesia used during the test).  °NUTRITION °· Drink plenty of fluids.  °· You may resume your normal diet as instructed by your doctor.  °· Begin with a light meal and progress to your normal diet. Heavy or fried foods are harder to digest and may make you feel sick to your stomach (nauseated).  °· Avoid alcoholic beverages for 24 hours or as instructed.  °MEDICATIONS °· You may resume your normal medications unless your doctor tells you otherwise.  °WHAT YOU CAN EXPECT TODAY °· Some feelings of bloating in the abdomen.  °· Passage of more gas than usual.  °· Spotting of blood in your stool or on the toilet paper.  °IF YOU HAD POLYPS REMOVED DURING THE COLONOSCOPY: °· No aspirin products for 7 days or as instructed.  °· No alcohol for 7 days or as instructed.  °· Eat a soft diet for the next 24 hours.  °FINDING OUT THE RESULTS OF YOUR TEST °Not all test results are available during your visit. If your test results are not back during the visit, make an appointment with your caregiver to find out the results. Do not assume everything is normal if you have not heard from your caregiver or the medical facility. It is important for you to follow up on all of your test results.  °SEEK IMMEDIATE MEDICAL ATTENTION IF: °· You have more than a spotting of blood in your stool.  °· Your belly is swollen (abdominal distention).  °· You are nauseated or vomiting.  °· You have a temperature over 101.  °· You have abdominal pain or discomfort that is severe or gets worse throughout the day.  ° ° ° °Colon polyp information provided ° °Further recommendations to follow pending review of pathology report °

## 2018-05-16 ENCOUNTER — Encounter: Payer: Self-pay | Admitting: Internal Medicine

## 2018-05-16 DIAGNOSIS — Z6837 Body mass index (BMI) 37.0-37.9, adult: Secondary | ICD-10-CM | POA: Diagnosis not present

## 2018-05-16 DIAGNOSIS — J019 Acute sinusitis, unspecified: Secondary | ICD-10-CM | POA: Diagnosis not present

## 2018-05-20 ENCOUNTER — Encounter (HOSPITAL_COMMUNITY): Payer: Self-pay | Admitting: Internal Medicine

## 2018-08-05 DIAGNOSIS — J069 Acute upper respiratory infection, unspecified: Secondary | ICD-10-CM | POA: Diagnosis not present

## 2018-08-05 DIAGNOSIS — R05 Cough: Secondary | ICD-10-CM | POA: Diagnosis not present

## 2018-08-05 DIAGNOSIS — Z6837 Body mass index (BMI) 37.0-37.9, adult: Secondary | ICD-10-CM | POA: Diagnosis not present

## 2018-09-04 DIAGNOSIS — S30861A Insect bite (nonvenomous) of abdominal wall, initial encounter: Secondary | ICD-10-CM | POA: Diagnosis not present

## 2018-10-04 DIAGNOSIS — S339XXA Sprain of unspecified parts of lumbar spine and pelvis, initial encounter: Secondary | ICD-10-CM | POA: Diagnosis not present

## 2018-10-04 DIAGNOSIS — M545 Low back pain: Secondary | ICD-10-CM | POA: Diagnosis not present

## 2018-10-04 DIAGNOSIS — Z6837 Body mass index (BMI) 37.0-37.9, adult: Secondary | ICD-10-CM | POA: Diagnosis not present

## 2018-10-30 DIAGNOSIS — Z6837 Body mass index (BMI) 37.0-37.9, adult: Secondary | ICD-10-CM | POA: Diagnosis not present

## 2018-10-30 DIAGNOSIS — R1011 Right upper quadrant pain: Secondary | ICD-10-CM | POA: Diagnosis not present

## 2019-02-21 DIAGNOSIS — R739 Hyperglycemia, unspecified: Secondary | ICD-10-CM | POA: Diagnosis not present

## 2019-02-21 DIAGNOSIS — E7800 Pure hypercholesterolemia, unspecified: Secondary | ICD-10-CM | POA: Diagnosis not present

## 2019-02-21 DIAGNOSIS — N400 Enlarged prostate without lower urinary tract symptoms: Secondary | ICD-10-CM | POA: Diagnosis not present

## 2019-02-26 DIAGNOSIS — E7800 Pure hypercholesterolemia, unspecified: Secondary | ICD-10-CM | POA: Diagnosis not present

## 2019-02-26 DIAGNOSIS — M545 Low back pain: Secondary | ICD-10-CM | POA: Diagnosis not present

## 2019-02-26 DIAGNOSIS — H6123 Impacted cerumen, bilateral: Secondary | ICD-10-CM | POA: Diagnosis not present

## 2019-02-26 DIAGNOSIS — Z6836 Body mass index (BMI) 36.0-36.9, adult: Secondary | ICD-10-CM | POA: Diagnosis not present

## 2019-02-26 DIAGNOSIS — Z0000 Encounter for general adult medical examination without abnormal findings: Secondary | ICD-10-CM | POA: Diagnosis not present

## 2019-03-13 DIAGNOSIS — H6120 Impacted cerumen, unspecified ear: Secondary | ICD-10-CM | POA: Diagnosis not present

## 2019-09-29 DIAGNOSIS — J309 Allergic rhinitis, unspecified: Secondary | ICD-10-CM | POA: Diagnosis not present

## 2019-09-29 DIAGNOSIS — J019 Acute sinusitis, unspecified: Secondary | ICD-10-CM | POA: Diagnosis not present

## 2019-09-29 DIAGNOSIS — J069 Acute upper respiratory infection, unspecified: Secondary | ICD-10-CM | POA: Diagnosis not present

## 2019-10-08 DIAGNOSIS — M47817 Spondylosis without myelopathy or radiculopathy, lumbosacral region: Secondary | ICD-10-CM | POA: Diagnosis not present

## 2019-10-08 DIAGNOSIS — M1712 Unilateral primary osteoarthritis, left knee: Secondary | ICD-10-CM | POA: Diagnosis not present

## 2019-11-12 DIAGNOSIS — S30861A Insect bite (nonvenomous) of abdominal wall, initial encounter: Secondary | ICD-10-CM | POA: Diagnosis not present

## 2019-11-12 DIAGNOSIS — M6208 Separation of muscle (nontraumatic), other site: Secondary | ICD-10-CM | POA: Diagnosis not present

## 2019-11-12 DIAGNOSIS — Z6837 Body mass index (BMI) 37.0-37.9, adult: Secondary | ICD-10-CM | POA: Diagnosis not present

## 2019-11-29 DIAGNOSIS — J309 Allergic rhinitis, unspecified: Secondary | ICD-10-CM | POA: Diagnosis not present

## 2019-11-29 DIAGNOSIS — J069 Acute upper respiratory infection, unspecified: Secondary | ICD-10-CM | POA: Diagnosis not present

## 2019-11-29 DIAGNOSIS — Z6837 Body mass index (BMI) 37.0-37.9, adult: Secondary | ICD-10-CM | POA: Diagnosis not present

## 2020-01-30 DIAGNOSIS — J302 Other seasonal allergic rhinitis: Secondary | ICD-10-CM | POA: Diagnosis not present

## 2020-02-27 DIAGNOSIS — N400 Enlarged prostate without lower urinary tract symptoms: Secondary | ICD-10-CM | POA: Diagnosis not present

## 2020-02-27 DIAGNOSIS — R739 Hyperglycemia, unspecified: Secondary | ICD-10-CM | POA: Diagnosis not present

## 2020-02-27 DIAGNOSIS — Z0001 Encounter for general adult medical examination with abnormal findings: Secondary | ICD-10-CM | POA: Diagnosis not present

## 2020-02-27 DIAGNOSIS — Z6837 Body mass index (BMI) 37.0-37.9, adult: Secondary | ICD-10-CM | POA: Diagnosis not present

## 2020-02-27 DIAGNOSIS — E7800 Pure hypercholesterolemia, unspecified: Secondary | ICD-10-CM | POA: Diagnosis not present

## 2020-02-27 DIAGNOSIS — R5383 Other fatigue: Secondary | ICD-10-CM | POA: Diagnosis not present

## 2020-03-02 DIAGNOSIS — H6120 Impacted cerumen, unspecified ear: Secondary | ICD-10-CM | POA: Diagnosis not present

## 2020-03-02 DIAGNOSIS — Z0001 Encounter for general adult medical examination with abnormal findings: Secondary | ICD-10-CM | POA: Diagnosis not present

## 2020-03-02 DIAGNOSIS — E7800 Pure hypercholesterolemia, unspecified: Secondary | ICD-10-CM | POA: Diagnosis not present

## 2020-03-02 DIAGNOSIS — N400 Enlarged prostate without lower urinary tract symptoms: Secondary | ICD-10-CM | POA: Diagnosis not present

## 2020-03-02 DIAGNOSIS — R739 Hyperglycemia, unspecified: Secondary | ICD-10-CM | POA: Diagnosis not present

## 2020-03-02 DIAGNOSIS — M6208 Separation of muscle (nontraumatic), other site: Secondary | ICD-10-CM | POA: Diagnosis not present

## 2020-03-02 DIAGNOSIS — Z6838 Body mass index (BMI) 38.0-38.9, adult: Secondary | ICD-10-CM | POA: Diagnosis not present

## 2020-03-02 DIAGNOSIS — M1712 Unilateral primary osteoarthritis, left knee: Secondary | ICD-10-CM | POA: Diagnosis not present

## 2020-03-19 DIAGNOSIS — H524 Presbyopia: Secondary | ICD-10-CM | POA: Diagnosis not present

## 2020-04-08 DIAGNOSIS — H9190 Unspecified hearing loss, unspecified ear: Secondary | ICD-10-CM | POA: Diagnosis not present

## 2020-04-08 DIAGNOSIS — Z6837 Body mass index (BMI) 37.0-37.9, adult: Secondary | ICD-10-CM | POA: Diagnosis not present

## 2020-04-08 DIAGNOSIS — H6123 Impacted cerumen, bilateral: Secondary | ICD-10-CM | POA: Diagnosis not present

## 2020-04-28 DIAGNOSIS — J019 Acute sinusitis, unspecified: Secondary | ICD-10-CM | POA: Diagnosis not present

## 2020-04-28 DIAGNOSIS — J309 Allergic rhinitis, unspecified: Secondary | ICD-10-CM | POA: Diagnosis not present

## 2020-09-15 DIAGNOSIS — Z6838 Body mass index (BMI) 38.0-38.9, adult: Secondary | ICD-10-CM | POA: Diagnosis not present

## 2020-09-15 DIAGNOSIS — M79672 Pain in left foot: Secondary | ICD-10-CM | POA: Diagnosis not present

## 2020-09-15 DIAGNOSIS — B353 Tinea pedis: Secondary | ICD-10-CM | POA: Diagnosis not present

## 2020-09-15 DIAGNOSIS — N529 Male erectile dysfunction, unspecified: Secondary | ICD-10-CM | POA: Diagnosis not present

## 2020-11-10 DIAGNOSIS — I2699 Other pulmonary embolism without acute cor pulmonale: Secondary | ICD-10-CM

## 2020-11-10 DIAGNOSIS — I82402 Acute embolism and thrombosis of unspecified deep veins of left lower extremity: Secondary | ICD-10-CM

## 2020-11-10 HISTORY — DX: Acute embolism and thrombosis of unspecified deep veins of left lower extremity: I82.402

## 2020-11-10 HISTORY — DX: Other pulmonary embolism without acute cor pulmonale: I26.99

## 2020-11-24 ENCOUNTER — Other Ambulatory Visit: Payer: Self-pay | Admitting: General Practice

## 2020-11-24 ENCOUNTER — Other Ambulatory Visit: Payer: Self-pay

## 2020-11-24 ENCOUNTER — Encounter (HOSPITAL_COMMUNITY): Payer: Self-pay

## 2020-11-24 ENCOUNTER — Ambulatory Visit (HOSPITAL_COMMUNITY)
Admission: RE | Admit: 2020-11-24 | Discharge: 2020-11-24 | Disposition: A | Discharge status: Home / Self Care | Payer: PPO | Source: Ambulatory Visit | Attending: General Practice | Admitting: General Practice

## 2020-11-24 ENCOUNTER — Inpatient Hospital Stay (HOSPITAL_COMMUNITY)
Admission: EM | Admit: 2020-11-24 | Discharge: 2020-11-27 | DRG: 299 | Disposition: A | Discharge status: Home / Self Care | Payer: PPO | Source: Ambulatory Visit | Attending: Internal Medicine | Admitting: Internal Medicine

## 2020-11-24 ENCOUNTER — Other Ambulatory Visit (HOSPITAL_COMMUNITY): Payer: Self-pay | Admitting: General Practice

## 2020-11-24 DIAGNOSIS — I82402 Acute embolism and thrombosis of unspecified deep veins of left lower extremity: Secondary | ICD-10-CM | POA: Diagnosis not present

## 2020-11-24 DIAGNOSIS — Z20822 Contact with and (suspected) exposure to covid-19: Secondary | ICD-10-CM | POA: Diagnosis present

## 2020-11-24 DIAGNOSIS — I480 Paroxysmal atrial fibrillation: Secondary | ICD-10-CM | POA: Diagnosis not present

## 2020-11-24 DIAGNOSIS — I700 Atherosclerosis of aorta: Secondary | ICD-10-CM | POA: Diagnosis not present

## 2020-11-24 DIAGNOSIS — I82432 Acute embolism and thrombosis of left popliteal vein: Secondary | ICD-10-CM | POA: Diagnosis not present

## 2020-11-24 DIAGNOSIS — I2694 Multiple subsegmental pulmonary emboli without acute cor pulmonale: Secondary | ICD-10-CM | POA: Diagnosis not present

## 2020-11-24 DIAGNOSIS — I4891 Unspecified atrial fibrillation: Secondary | ICD-10-CM | POA: Diagnosis not present

## 2020-11-24 DIAGNOSIS — K449 Diaphragmatic hernia without obstruction or gangrene: Secondary | ICD-10-CM | POA: Diagnosis not present

## 2020-11-24 DIAGNOSIS — F419 Anxiety disorder, unspecified: Secondary | ICD-10-CM | POA: Diagnosis not present

## 2020-11-24 DIAGNOSIS — I82412 Acute embolism and thrombosis of left femoral vein: Secondary | ICD-10-CM | POA: Diagnosis present

## 2020-11-24 DIAGNOSIS — M79662 Pain in left lower leg: Secondary | ICD-10-CM

## 2020-11-24 DIAGNOSIS — Z86718 Personal history of other venous thrombosis and embolism: Secondary | ICD-10-CM | POA: Insufficient documentation

## 2020-11-24 DIAGNOSIS — Z6836 Body mass index (BMI) 36.0-36.9, adult: Secondary | ICD-10-CM

## 2020-11-24 DIAGNOSIS — Z885 Allergy status to narcotic agent status: Secondary | ICD-10-CM

## 2020-11-24 DIAGNOSIS — I2609 Other pulmonary embolism with acute cor pulmonale: Secondary | ICD-10-CM | POA: Diagnosis not present

## 2020-11-24 DIAGNOSIS — R0602 Shortness of breath: Secondary | ICD-10-CM | POA: Diagnosis not present

## 2020-11-24 DIAGNOSIS — I82462 Acute embolism and thrombosis of left calf muscular vein: Principal | ICD-10-CM | POA: Diagnosis present

## 2020-11-24 DIAGNOSIS — I2510 Atherosclerotic heart disease of native coronary artery without angina pectoris: Secondary | ICD-10-CM | POA: Diagnosis present

## 2020-11-24 DIAGNOSIS — I471 Supraventricular tachycardia: Secondary | ICD-10-CM | POA: Diagnosis not present

## 2020-11-24 DIAGNOSIS — I2699 Other pulmonary embolism without acute cor pulmonale: Secondary | ICD-10-CM | POA: Diagnosis not present

## 2020-11-24 DIAGNOSIS — Z8249 Family history of ischemic heart disease and other diseases of the circulatory system: Secondary | ICD-10-CM | POA: Diagnosis not present

## 2020-11-24 DIAGNOSIS — I824Y2 Acute embolism and thrombosis of unspecified deep veins of left proximal lower extremity: Secondary | ICD-10-CM

## 2020-11-24 DIAGNOSIS — M109 Gout, unspecified: Secondary | ICD-10-CM | POA: Diagnosis present

## 2020-11-24 LAB — COMPREHENSIVE METABOLIC PANEL
ALT: 25 U/L (ref 0–44)
AST: 20 U/L (ref 15–41)
Albumin: 3.8 g/dL (ref 3.5–5.0)
Alkaline Phosphatase: 68 U/L (ref 38–126)
Anion gap: 9 (ref 5–15)
BUN: 18 mg/dL (ref 8–23)
CO2: 22 mmol/L (ref 22–32)
Calcium: 8.5 mg/dL — ABNORMAL LOW (ref 8.9–10.3)
Chloride: 104 mmol/L (ref 98–111)
Creatinine, Ser: 0.88 mg/dL (ref 0.61–1.24)
GFR, Estimated: >60 mL/min (ref >60)
Glucose, Bld: 113 mg/dL — ABNORMAL HIGH (ref 70–99)
Potassium: 3.9 mmol/L (ref 3.5–5.1)
Sodium: 135 mmol/L (ref 135–145)
Total Bilirubin: 0.5 mg/dL (ref 0.3–1.2)
Total Protein: 7 g/dL (ref 6.5–8.1)

## 2020-11-24 LAB — CBC WITH DIFFERENTIAL/PLATELET
Abs Immature Granulocytes: 0.05 10*3/uL (ref 0.00–0.07)
Basophils Absolute: 0.1 10*3/uL (ref 0.0–0.1)
Basophils Relative: 1 %
Eosinophils Absolute: 0.3 10*3/uL (ref 0.0–0.5)
Eosinophils Relative: 3 %
HCT: 49.5 % (ref 39.0–52.0)
Hemoglobin: 16.4 g/dL (ref 13.0–17.0)
Immature Granulocytes: 1 %
Lymphocytes Relative: 20 %
Lymphs Abs: 1.9 10*3/uL (ref 0.7–4.0)
MCH: 30.3 pg (ref 26.0–34.0)
MCHC: 33.1 g/dL (ref 30.0–36.0)
MCV: 91.5 fL (ref 80.0–100.0)
Monocytes Absolute: 1.4 10*3/uL — ABNORMAL HIGH (ref 0.1–1.0)
Monocytes Relative: 14 %
Neutro Abs: 6 10*3/uL (ref 1.7–7.7)
Neutrophils Relative %: 61 %
Platelets: 195 10*3/uL (ref 150–400)
RBC: 5.41 MIL/uL (ref 4.22–5.81)
RDW: 13.8 % (ref 11.5–15.5)
WBC: 9.6 10*3/uL (ref 4.0–10.5)
nRBC: 0 % (ref 0.0–0.2)

## 2020-11-24 LAB — RESP PANEL BY RT-PCR (FLU A&B, COVID) ARPGX2
Influenza A by PCR: NEGATIVE
Influenza B by PCR: NEGATIVE
SARS Coronavirus 2 by RT PCR: NEGATIVE

## 2020-11-24 LAB — TROPONIN I (HIGH SENSITIVITY)
Troponin I (High Sensitivity): 7 ng/L (ref <18)
Troponin I (High Sensitivity): 7 ng/L (ref <18)

## 2020-11-24 LAB — POCT I-STAT CREATININE: Creatinine, Ser: 1 mg/dL (ref 0.61–1.24)

## 2020-11-24 LAB — BRAIN NATRIURETIC PEPTIDE: B Natriuretic Peptide: 61 pg/mL (ref 0.0–100.0)

## 2020-11-24 MED ORDER — SODIUM CHLORIDE 0.9 % IV SOLN: INTRAVENOUS | Status: AC

## 2020-11-24 MED ORDER — ONDANSETRON HCL 4 MG/2ML IJ SOLN: 4.0000 mg | Freq: Four times a day (QID) | INTRAMUSCULAR | Status: DC | PRN

## 2020-11-24 MED ORDER — IOHEXOL 350 MG/ML SOLN
100.0000 mL | Freq: Once | INTRAVENOUS | Status: AC | PRN
  Administered 2020-11-24: 100 mL via INTRAVENOUS

## 2020-11-24 MED ORDER — ACETAMINOPHEN 325 MG PO TABS: 650.0000 mg | ORAL_TABLET | Freq: Four times a day (QID) | ORAL | Status: DC | PRN

## 2020-11-24 MED ORDER — ACETAMINOPHEN 650 MG RE SUPP: 650.0000 mg | Freq: Four times a day (QID) | RECTAL | Status: DC | PRN

## 2020-11-24 MED ORDER — ONDANSETRON HCL 4 MG PO TABS: 4.0000 mg | ORAL_TABLET | Freq: Four times a day (QID) | ORAL | Status: DC | PRN

## 2020-11-24 MED ORDER — HEPARIN BOLUS VIA INFUSION
3000.0000 [IU] | Freq: Once | INTRAVENOUS | Status: AC
  Administered 2020-11-24: 3000 [IU] via INTRAVENOUS
  Filled 2020-11-24: qty 3000

## 2020-11-24 MED ORDER — GUAIFENESIN-DM 100-10 MG/5ML PO SYRP
5.0000 mL | ORAL_SOLUTION | ORAL | Status: DC | PRN
  Administered 2020-11-24 – 2020-11-25 (×3): 5 mL via ORAL
  Filled 2020-11-24 (×3): qty 5

## 2020-11-24 MED ORDER — POLYETHYLENE GLYCOL 3350 17 G PO PACK: 17.0000 g | PACK | Freq: Every day | ORAL | Status: DC | PRN

## 2020-11-24 MED ORDER — HEPARIN (PORCINE) 25000 UT/250ML-% IV SOLN
1800.0000 [IU]/h | INTRAVENOUS | Status: DC
  Administered 2020-11-24: 1450 [IU]/h via INTRAVENOUS
  Administered 2020-11-25: 1600 [IU]/h via INTRAVENOUS
  Administered 2020-11-26 – 2020-11-27 (×3): 1800 [IU]/h via INTRAVENOUS
  Filled 2020-11-24 (×5): qty 250

## 2020-11-24 NOTE — ED Provider Notes (Signed)
Emergency Department Provider Note   I have reviewed the triage vital signs and the nursing notes.   HISTORY  Chief Complaint Shortness of Breath   HPI Brian Leach is a 70 y.o. male with past medical history reviewed below presents the emergency department after an outpatient CT scan showed blood clots in the left leg as well as lungs.  Patient had some intermittent pain in the left calf/leg area along with some mild swelling in that extremity.  He was sent by his PCP for DVT ultrasound today which showed an acute DVT.  He was given Eliquis which he took at noon but noted some shortness of breath symptoms.  He states has had intermittent shortness of breath but did feel like symptoms were slightly worse in the past few days.  Denies any chest pain.  No syncope.  He was sent back for CT angio of the chest which showed bilateral PE with heart strain and he was redirected to the emergency department.  He is not having any fevers or chills.  No active chest pain symptoms.  Shortness of breath is mainly with exertion.    Past Medical History:  Diagnosis Date   Complication of anesthesia    difficult intubation   History of gout     Patient Active Problem List   Diagnosis Date Noted   SVT (supraventricular tachycardia) ([place]) 11/25/2020   Acute deep vein thrombosis (DVT) of proximal vein of left lower extremity (HCC)    Paroxysmal atrial fibrillation ([place])    Pulmonary embolism ([place]) 11/24/2020    Past Surgical History:  Procedure Laterality Date   COLONOSCOPY N/A 04/07/2013   Procedure: COLONOSCOPY;  Surgeon: Daneil Dolin, MD;  Location: [facility];  Service: Endoscopy;  Laterality: N/A;  7:30 AM   COLONOSCOPY N/A 05/14/2018   Procedure: COLONOSCOPY;  Surgeon: Daneil Dolin, MD;  Location: [facility];  Service: Endoscopy;  Laterality: N/A;  10:30   KNEE ARTHROSCOPY Left    POLYPECTOMY  05/14/2018   Procedure: POLYPECTOMY;  Surgeon: Daneil Dolin, MD;  Location: [facility];  Service: Endoscopy;;  colon     Allergies Codeine  Family History  Problem Relation Age of Onset   Heart disease Brother     Social History Social History   Tobacco Use   Smoking status: Never   Smokeless tobacco: Never  Vaping Use   Vaping Use: Never used  Substance Use Topics   Alcohol use: No   Drug use: No    Review of Systems  Constitutional: No fever/chills Eyes: No visual changes. ENT: No sore throat. Cardiovascular: Denies chest pain. Respiratory: Mild exertional shortness of breath. Gastrointestinal: No abdominal pain.  No nausea, no vomiting.  No diarrhea.  No constipation. Genitourinary: Negative for dysuria. Musculoskeletal: Negative for back pain. Positive left leg pain/swelling.  Skin: Negative for rash. Neurological: Negative for headaches, focal weakness or numbness.  10-point ROS otherwise negative.  ____________________________________________   PHYSICAL EXAM:  VITAL SIGNS: ED Triage Vitals  Enc Vitals Group     BP 11/24/20 1523 (!) 160/75     Pulse Rate 11/24/20 1523 87     Resp 11/24/20 1523 18     Temp 11/24/20 1523 (!) 97.5 F (36.4 C)     Temp Source 11/24/20 1523 Oral     SpO2 11/24/20 1523 96 %     Weight 11/24/20 1526 243 lb (110.2 kg)     Height 11/24/20 1526 5' 7.5" (1.715 m)  Constitutional: Alert and oriented. Well appearing and in no acute distress. Eyes: Conjunctivae are normal.  Head: Atraumatic. Nose: No congestion/rhinnorhea. Mouth/Throat: Mucous membranes are moist.   Neck: No stridor.   Cardiovascular: Normal rate, regular rhythm. Good peripheral circulation. Grossly normal heart sounds.   Respiratory: Normal respiratory effort.  No retractions. Lungs CTAB. Gastrointestinal: Soft and nontender. No distention.  Musculoskeletal: No lower extremity tenderness with mild left leg swelling compared to the right. No gross deformities of extremities. Neurologic:  Normal speech and language.  Skin:  Skin is  warm, dry and intact. No rash noted.  ____________________________________________   LABS (all labs ordered are listed, but only abnormal results are displayed)  Labs Reviewed  COMPREHENSIVE METABOLIC PANEL - Abnormal; Notable for the following components:      Result Value   Glucose, Bld 113 (*)    Calcium 8.5 (*)    All other components within normal limits  CBC WITH DIFFERENTIAL/PLATELET - Abnormal; Notable for the following components:   Monocytes Absolute 1.4 (*)    All other components within normal limits  BASIC METABOLIC PANEL - Abnormal; Notable for the following components:   Glucose, Bld 115 (*)    Calcium 8.4 (*)    All other components within normal limits  HEPARIN LEVEL (UNFRACTIONATED) - Abnormal; Notable for the following components:   Heparin Unfractionated 1.05 (*)    All other components within normal limits  APTT - Abnormal; Notable for the following components:   aPTT 57 (*)    All other components within normal limits  APTT - Abnormal; Notable for the following components:   aPTT 53 (*)    All other components within normal limits  RESP PANEL BY RT-PCR (FLU A&B, COVID) ARPGX2  MRSA NEXT GEN BY PCR, NASAL  BRAIN NATRIURETIC PEPTIDE  HIV ANTIBODY (ROUTINE TESTING W REFLEX)  CBC  HEPARIN LEVEL (UNFRACTIONATED)  APTT  CBC  BASIC METABOLIC PANEL  MAGNESIUM  TSH  T4, FREE  LIPID PANEL  APTT  TROPONIN I (HIGH SENSITIVITY)  TROPONIN I (HIGH SENSITIVITY)   ____________________________________________  EKG   EKG Interpretation  Date/Time:  Wednesday November 24 2020 16:53:36 EDT Ventricular Rate:  103 PR Interval:  192 QRS Duration: 92 QT Interval:  376 QTC Calculation: 488 R Axis:   75 Text Interpretation: Sinus tachycardia with irregular rate Borderline prolonged QT interval No old tracing to compare Confirmed by Nanda Quinton [phone]) on 11/24/2020 5:58:01 PM         ____________________________________________  RADIOLOGY  CT Angio Chest  Pulmonary Embolism (PE) W or WO Contrast  Result Date: 11/24/2020 CLINICAL DATA:  Rule out pulmonary embolus.  DVT. EXAM: CT ANGIOGRAPHY CHEST WITH CONTRAST TECHNIQUE: Multidetector CT imaging of the chest was performed using the standard protocol during bolus administration of intravenous contrast. Multiplanar CT image reconstructions and MIPs were obtained to evaluate the vascular anatomy. CONTRAST:  171mL OMNIPAQUE IOHEXOL 350 MG/ML SOLN COMPARISON:  None. FINDINGS: Cardiovascular: There is a clot within the distal right main pulmonary artery which extends into the right upper lobe are and segmental pulmonary arteries compatible with acute pulmonary embolus. Segmental filling defects within the posterior left lower lobe also noted compatible with acute pulmonary embolus, image 202/5. Filling defects within segmental branches of the right middle lobe pulmonary artery and subsegmental branches of the right lower lobe pulmonary artery are also noted. The heart size appears normal.  The RV to LV ratio is equal to 1.03. Mild aortic atherosclerosis and mild coronary artery calcifications. Mediastinum/Nodes:  No enlarged mediastinal, hilar, or axillary lymph nodes. Thyroid gland, trachea, and esophagus demonstrate no significant findings. Small hiatal hernia. Lungs/Pleura: No pleural effusion. No airspace consolidation, atelectasis, or pneumothorax. Upper Abdomen: No acute abnormality within the imaged portions of the upper abdomen. Musculoskeletal: Degenerative disc disease identified. No acute or suspicious findings. Review of the MIP images confirms the above findings. IMPRESSION: 1. Examination is positive for acute bilateral pulmonary emboli. Positive for acute PE with of right heart strain (RV/LV Ratio 1.03) consistent with at least submassive (intermediate risk) PE. The presence of right heart strain has been associated with an increased risk of morbidity and mortality. 2. Aortic atherosclerosis. Coronary artery  calcifications. Aortic Atherosclerosis (ICD10-I70.0). Critical Value/emergent results were called by telephone at the time of interpretation on 11/24/2020 at 2:57 pm to provider AMY BOYD , who verbally acknowledged these results. Electronically Signed   By: Kerby Moors M.D.   On: 11/24/2020 14:58   US Venous Img Lower Unilateral Left (DVT)  Result Date: 11/24/2020 CLINICAL DATA:  Pain, edema EXAM: LEFT LOWER EXTREMITY VENOUS DOPPLER ULTRASOUND TECHNIQUE: Gray-scale sonography with compression, as well as color and duplex ultrasound, were performed to evaluate the deep venous system(s) from the level of the common femoral vein through the popliteal and proximal calf veins. COMPARISON:  02/17/2005 by report only FINDINGS: VENOUS Normal compressibility of the common femoral, superficial femoral veins. The distal femoral vein is noncompressible with hypoechoic partially occlusive thrombus, with only a small amount of color flow signal identified. Similarly the popliteal vein is mostly occluded by hypoechoic thrombus. Noncompressible soleal, anterior tibial, and posterior tibial veins in the calf. Limited views of the contralateral common femoral vein are unremarkable. OTHER None. Limitations: none IMPRESSION: 1. POSITIVE for occlusive DVT in left calf extending through the popliteal vein into the distal femoral vein. These results will be called to the ordering clinician or representative by the Radiology Department at the imaging location. Electronically Signed   By: Lucrezia Europe M.D.   On: 11/24/2020 11:54    ____________________________________________   PROCEDURES  Procedure(s) performed:   Procedures  CRITICAL CARE Performed by: Margette Fast Total critical care time: 35 minutes Critical care time was exclusive of separately billable procedures and treating other patients. Critical care was necessary to treat or prevent imminent or life-threatening deterioration. Critical care was time spent  personally by me on the following activities: development of treatment plan with patient and/or surrogate as well as nursing, discussions with consultants, evaluation of patient's response to treatment, examination of patient, obtaining history from patient or surrogate, ordering and performing treatments and interventions, ordering and review of laboratory studies, ordering and review of radiographic studies, pulse oximetry and re-evaluation of patient's condition.  Nanda Quinton, MD Emergency Medicine  ____________________________________________   INITIAL IMPRESSION / ASSESSMENT AND PLAN / ED COURSE  Pertinent labs & imaging results that were available during my care of the patient were reviewed by me and considered in my medical decision making (see chart for details).   Patient presents to the emergency department with acute DVT in the left calf as well as acute, bilateral PE on CT angio of the chest performed as an outpatient today.  There is radiographic evidence of right heart strain on CT as documented.  Patient's vital signs, however, are normal.  He has fairly minimal symptoms.  Despite this, given the size of clot and heart strain I feel he would benefit from observation and possible echo. Took first dose of Eliquis at noon  today.   Discussed patient's case with [facility] to request admission. Patient and family (if present) updated with plan. Care transferred to [facility] service.  I reviewed all nursing notes, vitals, pertinent old records, EKGs, labs, imaging (as available).  Spoke with critical care. Ok to stay at [facility].   ____________________________________________  FINAL CLINICAL IMPRESSION(S) / ED DIAGNOSES  Final diagnoses:  Multiple subsegmental pulmonary emboli without acute cor pulmonale (HCC)  Acute deep vein thrombosis (DVT) of proximal vein of left lower extremity ([place])     MEDICATIONS GIVEN DURING THIS VISIT:  Medications  acetaminophen (TYLENOL) tablet 650 mg (has no  administration in time range)    Or  acetaminophen (TYLENOL) suppository 650 mg (has no administration in time range)  ondansetron (ZOFRAN) tablet 4 mg (has no administration in time range)    Or  ondansetron (ZOFRAN) injection 4 mg (has no administration in time range)  polyethylene glycol (MIRALAX / GLYCOLAX) packet 17 g (has no administration in time range)  heparin ADULT infusion 100 units/mL (25000 units/248mL) (1,800 Units/hr Intravenous Rate/Dose Change 11/25/20 1919)  0.9 %  sodium chloride infusion (0 mLs Intravenous Stopped 11/25/20 1219)  guaiFENesin-dextromethorphan (ROBITUSSIN DM) 100-10 MG/5ML syrup 5 mL (5 mLs Oral Given 11/25/20 1514)  Chlorhexidine Gluconate Cloth 2 % PADS 6 each (6 each Topical Given 11/25/20 0935)  diltiazem (CARDIZEM) tablet 30 mg (30 mg Oral Given 11/25/20 2125)  perflutren lipid microspheres (DEFINITY) IV suspension (2 mLs Intravenous Given 11/25/20 1438)  HYDROcodone bit-homatropine (HYCODAN) 5-1.5 MG/5ML syrup 5 mL (5 mLs Oral Given 11/25/20 1645)  heparin bolus via infusion 3,000 Units (3,000 Units Intravenous Bolus from Bag 11/24/20 2127)  heparin bolus via infusion 1,500 Units (1,500 Units Intravenous Bolus from Bag 11/25/20 0822)  heparin bolus via infusion 1,500 Units (1,500 Units Intravenous Bolus from Bag 11/25/20 1920)    Note:  This document was prepared using Dragon voice recognition software and may include unintentional dictation errors.  Nanda Quinton, MD, [facility] Emergency Medicine    Kaedence Connelly, [facility], MD 11/25/20 2308

## 2020-11-24 NOTE — Progress Notes (Signed)
ANTICOAGULATION CONSULT NOTE - Initial Consult  Pharmacy Consult for heparin gtt  Indication: pulmonary embolus  Allergies  Allergen Reactions   Codeine Rash    Patient Measurements: Height: 5\' 7"  (170.2 cm) Weight: 106.2 kg (234 lb 2.1 oz) IBW/kg (Calculated) : 66.1 Heparin Dosing Weight: HEPARIN DW (KG): 89.7   Vital Signs: Temp: 99.2 F (37.3 C) (06/15 2041) Temp Source: Oral (06/15 2041) BP: 161/90 (06/15 2041) Pulse Rate: 87 (06/15 2041)  Labs: Recent Labs    11/24/20 1419 11/24/20 1612  HGB  --  16.4  HCT  --  49.5  PLT  --  195  CREATININE 1.00 0.88    Estimated Creatinine Clearance: 93.3 mL/min (by C-G formula based on SCr of 0.88 mg/dL).   Medical History: Past Medical History:  Diagnosis Date   Complication of anesthesia    difficult intubation   History of gout     Medications:  Medications Prior to Admission  Medication Sig Dispense Refill Last Dose   acetaminophen (TYLENOL) 500 MG tablet Take 500-1,000 mg by mouth every 8 (eight) hours as needed (pain).      diphenhydrAMINE (BENADRYL) 25 mg capsule Take 25 mg by mouth daily.      sodium chloride (OCEAN) 0.65 % SOLN nasal spray Place 1 spray into both nostrils as needed for congestion.      Scheduled:   heparin  3,000 Units Intravenous Once   Infusions:   heparin     PRN: acetaminophen **OR** acetaminophen, ondansetron **OR** ondansetron (ZOFRAN) IV, polyethylene glycol Anti-infectives (From admission, onward)    None       Assessment: Brian Leach a 70 y.o. male requires anticoagulation with a heparin iv infusion for the indication of  pulmonary embolus. Heparin gtt will be started following pharmacy protocol per pharmacy consult. Patient had a single dose of apixaban 10mg  today at 1230 per MD that will require aPTT/HL correlation before transitioning to only HL monitoring.   Goal of Therapy:  Heparin level 0.3-0.7 units/ml Monitor platelets by anticoagulation protocol: Yes    Plan:  Give 3000 units bolus x 1 Start heparin infusion at 1450 units/hr Check anti-Xa level in 6 hours and daily while on heparin Continue to monitor H&H and platelets   Donna Christen Westlynn Fifer 11/24/2020,9:01 PM

## 2020-11-24 NOTE — ED Triage Notes (Signed)
Pt to waiting room via wc, pt talking on phone, pt resps are even and unlabored.

## 2020-11-24 NOTE — ED Triage Notes (Signed)
Pt to er, pt states that he had an ultrasound this am and found out that he had a blood clot in his leg, states that then he had a ct and they said that he had a blood clot in his lung, states that no one told him anything but wheeled him over to the er.

## 2020-11-24 NOTE — H&P (Signed)
History and Physical    Brian Leach LGX:[id number] DOB: [birth year] DOA: 11/24/2020  PCP: Curlene Labrum, MD    Patient coming from: Home  I have personally briefly reviewed patient's old medical records in [place]  Chief Complaint: Leg pain.   HPI: Brian Leach is a 70 y.o. male with medical history significant for gout.  Patient presents to the ED, reports of blood clots in his lungs and chest.  Patient had ultrasound done of his left lower extremity for pain.  He reports minimal to no swelling in his lower extremities.  No redness.  Ultrasound was positive for DVT, subsequently had CT chest done as patient reported he was having some mild difficulty breathing with exertion. On my evaluation, he denies chest pain, he has not had any difficulty breathing at rest, but reports becoming short winded when walking long distances.  Over the past year, patient has not been very active, sitting most of the day.  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  Never smoked cigarettes.  Not on medications.  With diagnosis of DVT, patient was started on Eliquis which he had his first dose at about 12:30 PM today.  Before subsequent CTA showed PE and he was sent to the ED.  ED Course: T-max 99.2.  Heart rate 70s to 80s.  Respiratory rate 17-22.  Blood pressure systolic 814G to 818.  O2 sats greater than 96% on room air.  EDP talked to critical care, recommended starting IV heparin, okay to admit [facility] at [facility].   CTA chest done as outpt-  acute bilateral PE, positive for acute PE with right heart strain consistent with at least submassive PE. Outpatient venous lower extremity Dopplers positive for occlusive DVT in the left calf extending through the popliteal vein into the distal femoral vein.  Review of Systems: As per HPI all other systems reviewed and  negative.  Past Medical History:  Diagnosis Date   Complication of anesthesia    difficult intubation   History of gout     Past Surgical History:  Procedure Laterality Date   COLONOSCOPY N/A 04/07/2013   Procedure: COLONOSCOPY;  Surgeon: Daneil Dolin, MD;  Location: [facility];  Service: Endoscopy;  Laterality: N/A;  7:30 AM   COLONOSCOPY N/A 05/14/2018   Procedure: COLONOSCOPY;  Surgeon: Daneil Dolin, MD;  Location: [facility];  Service: Endoscopy;  Laterality: N/A;  10:30   KNEE ARTHROSCOPY Left    POLYPECTOMY  05/14/2018   Procedure: POLYPECTOMY;  Surgeon: Daneil Dolin, MD;  Location: [facility];  Service: Endoscopy;;  colon      reports that he has never smoked. He has never used smokeless tobacco. He reports that he does not drink alcohol and does not use drugs.  Allergies  Allergen Reactions   Codeine Rash   Family history of hypertension.  Prior to Admission medications   Medication Sig Start Date End Date Taking? Authorizing Provider  acetaminophen (TYLENOL) 500 MG tablet Take 500-1,000 mg by mouth every 8 (eight) hours as needed (pain).    [provider]  diphenhydrAMINE (BENADRYL) 25 mg capsule Take 25 mg by mouth daily.    [provider]  sodium chloride (OCEAN) 0.65 % SOLN nasal spray Place 1 spray into both nostrils as needed for  congestion.    [provider]    Physical Exam: Vitals:   11/24/20 1730 11/24/20 1835 11/24/20 2033 11/24/20 2041  BP: 129/68 132/76  (!) 161/90  Pulse: 75 72  87  Resp: (!) 22 20  17   Temp:    99.2 F (37.3 C)  TempSrc:    Oral  SpO2: 98% 98%  98%  Weight:   106.2 kg   Height:   5\' 7"  (1.702 m)     Constitutional: NAD, calm, comfortable, eating dinner Vitals:   11/24/20 1730 11/24/20 1835 11/24/20 2033 11/24/20 2041  BP: 129/68 132/76  (!) 161/90  Pulse: 75 72  87  Resp: (!) 22 20  17   Temp:    99.2 F (37.3 C)  TempSrc:    Oral  SpO2: 98% 98%  98%  Weight:   106.2 kg    Height:   5\' 7"  (1.702 m)    Eyes: PERRL, lids and conjunctivae normal ENMT: Mucous membranes are moist. Posterior pharynx clear of any exudate or lesions.Normal dentition.  Neck: normal, supple, no masses, no thyromegaly Respiratory: clear to auscultation bilaterally, no wheezing, no crackles. Normal respiratory effort. No accessory muscle use.  Cardiovascular: Regular rate and rhythm, no murmurs / rubs / gallops. No extremity edema. 2+ pedal pulses.  Abdomen: no tenderness, no masses palpated. No hepatosplenomegaly. Bowel sounds positive.  Musculoskeletal: no clubbing / cyanosis. No joint deformity upper and lower extremities. Good ROM, no contractures. Normal muscle tone.  Skin: no rashes, lesions, ulcers. No induration Neurologic: No apparent cranial abnormalities, moving extremities spontaneously. Psychiatric: Normal judgment and insight. Alert and oriented x 3. Normal mood.   Labs on Admission: I have personally reviewed following labs and imaging studies  CBC: Recent Labs  Lab 11/24/20 1612  WBC 9.6  NEUTROABS 6.0  HGB 16.4  HCT 49.5  MCV 91.5  PLT 465   Basic Metabolic Panel: Recent Labs  Lab 11/24/20 1419 11/24/20 1612  NA  --  135  K  --  3.9  CL  --  104  CO2  --  22  GLUCOSE  --  113*  BUN  --  18  CREATININE 1.00 0.88  CALCIUM  --  8.5*   GFR: Estimated Creatinine Clearance: 93.3 mL/min (by C-G formula based on SCr of 0.88 mg/dL). Liver Function Tests: Recent Labs  Lab 11/24/20 1612  AST 20  ALT 25  ALKPHOS 68  BILITOT 0.5  PROT 7.0  ALBUMIN 3.8    Radiological Exams on Admission: CT Angio Chest Pulmonary Embolism (PE) W or WO Contrast  Result Date: 11/24/2020 CLINICAL DATA:  Rule out pulmonary embolus.  DVT. EXAM: CT ANGIOGRAPHY CHEST WITH CONTRAST TECHNIQUE: Multidetector CT imaging of the chest was performed using the standard protocol during bolus administration of intravenous contrast. Multiplanar CT image reconstructions and MIPs were  obtained to evaluate the vascular anatomy. CONTRAST:  141mL OMNIPAQUE IOHEXOL 350 MG/ML SOLN COMPARISON:  None. FINDINGS: Cardiovascular: There is a clot within the distal right main pulmonary artery which extends into the right upper lobe are and segmental pulmonary arteries compatible with acute pulmonary embolus. Segmental filling defects within the posterior left lower lobe also noted compatible with acute pulmonary embolus, image 202/5. Filling defects within segmental branches of the right middle lobe pulmonary artery and subsegmental branches of the right lower lobe pulmonary artery are also noted. The heart size appears normal.  The RV to LV ratio is equal to 1.03. Mild aortic atherosclerosis and mild coronary artery  calcifications. Mediastinum/Nodes: No enlarged mediastinal, hilar, or axillary lymph nodes. Thyroid gland, trachea, and esophagus demonstrate no significant findings. Small hiatal hernia. Lungs/Pleura: No pleural effusion. No airspace consolidation, atelectasis, or pneumothorax. Upper Abdomen: No acute abnormality within the imaged portions of the upper abdomen. Musculoskeletal: Degenerative disc disease identified. No acute or suspicious findings. Review of the MIP images confirms the above findings. IMPRESSION: 1. Examination is positive for acute bilateral pulmonary emboli. Positive for acute PE with of right heart strain (RV/LV Ratio 1.03) consistent with at least submassive (intermediate risk) PE. The presence of right heart strain has been associated with an increased risk of morbidity and mortality. 2. Aortic atherosclerosis. Coronary artery calcifications. Aortic Atherosclerosis (ICD10-I70.0). Critical Value/emergent results were called by telephone at the time of interpretation on 11/24/2020 at 2:57 pm to provider AMY BOYD , who verbally acknowledged these results. Electronically Signed   By: Kerby Moors M.D.   On: 11/24/2020 14:58   US Venous Img Lower Unilateral Left  (DVT)  Result Date: 11/24/2020 CLINICAL DATA:  Pain, edema EXAM: LEFT LOWER EXTREMITY VENOUS DOPPLER ULTRASOUND TECHNIQUE: Gray-scale sonography with compression, as well as color and duplex ultrasound, were performed to evaluate the deep venous system(s) from the level of the common femoral vein through the popliteal and proximal calf veins. COMPARISON:  02/17/2005 by report only FINDINGS: VENOUS Normal compressibility of the common femoral, superficial femoral veins. The distal femoral vein is noncompressible with hypoechoic partially occlusive thrombus, with only a small amount of color flow signal identified. Similarly the popliteal vein is mostly occluded by hypoechoic thrombus. Noncompressible soleal, anterior tibial, and posterior tibial veins in the calf. Limited views of the contralateral common femoral vein are unremarkable. OTHER None. Limitations: none IMPRESSION: 1. POSITIVE for occlusive DVT in left calf extending through the popliteal vein into the distal femoral vein. These results will be called to the ordering clinician or representative by the Radiology Department at the imaging location. Electronically Signed   By: Lucrezia Europe M.D.   On: 11/24/2020 11:54    EKG: Independently reviewed.  Sinus tachycardia rate 103.  Irregular, but P waves present, QTc 488.  No significant ST or T wave abnormalities, no old EKG to compare.  Assessment/Plan Active Problems:   Pulmonary embolism (HCC)    Unprovoked pulmonary embolism- on room air, heart rate 70s to 80s.  So far appears unprovoked.  CTA chest suggest submassive PE with right heart strain. Left LLE us-shows occlusive left calf extending through the popliteal vein into the distal femoral vein.  Likely has good lung reserve, never smoked. -Obtain echocardiogram -Took first dose of Eliquis 10 mg today ~ 12.30, continue heparin - pharmacy to dose. - EDP talked to critical care, ok to admit here at [facility]. -Bedrest - N/s 100cc/hr x 15hrs  DVT  prophylaxis: Heparin Code Status: Full code Family Communication: Spouse at bedside Disposition Plan: ~ 2 days Consults called: None Admission status: Inpt, Step down I certify that at the point of admission it is my clinical judgment that the patient will require inpatient hospital care spanning beyond 2 midnights from the point of admission due to high intensity of service, high risk for further deterioration and high frequency of surveillance required. The following factors support the patient status of inpatient:    Bethena Roys MD [facility]  11/24/2020, 8:48 PM

## 2020-11-24 NOTE — ED Notes (Signed)
ED TO INPATIENT HANDOFF REPORT  ED Nurse Name and Phone #:   S Name/Age/Gender Ane Payment Brian Leach 70 y.o. male Room/Bed: APFT20/APFT20  Code Status   Code Status: Not on file  Home/SNF/Other Home Patient oriented to: self, place, time and situation Is this baseline? Yes   Triage Complete: Triage complete  Chief Complaint Pulmonary embolism [facility]) [I26.99]  Triage Note Pt to waiting room via wc, pt talking on phone, pt resps are even and unlabored.   Pt to er, pt states that he had an ultrasound this am and found out that he had a blood clot in his leg, states that then he had a ct and they said that he had a blood clot in his lung, states that no one told him anything but wheeled him over to the er.     Allergies Allergies  Allergen Reactions  . Codeine Rash    Level of Care/Admitting Diagnosis ED Disposition    ED Disposition  Admit   Condition  --   [place]: [facility] [[id number]]  Level of Care: Stepdown [14]  Covid Evaluation: Asymptomatic Screening Protocol (No Symptoms)  Diagnosis: Pulmonary embolism [facility]) [[id number]]  Admitting Physician: Kara Pacer  Attending Physician: Bethena Roys [phone]  Estimated length of stay: past midnight tomorrow  Certification:: I certify this patient will need inpatient services for at least 2 midnights         B Medical/Surgery History Past Medical History:  Diagnosis Date  . Complication of anesthesia    difficult intubation  . History of gout    Past Surgical History:  Procedure Laterality Date  . COLONOSCOPY N/A 04/07/2013   Procedure: COLONOSCOPY;  Surgeon: Daneil Dolin, MD;  [facility];  Service: Endoscopy;  Laterality: N/A;  7:30 AM  . COLONOSCOPY N/A 05/14/2018   Procedure: COLONOSCOPY;  Surgeon: Daneil Dolin, MD;  Location[facility];  Service: Endoscopy;  Laterality: N/A;  10:30  . KNEE ARTHROSCOPY Left   . POLYPECTOMY  05/14/2018    Procedure: POLYPECTOMY;  Surgeon: Daneil Dolin, MD;  Location: [facility];  Service: Endoscopy;;  colon      A IV Location/Drains/Wounds Patient Lines/Drains/Airways Status    Active Line/Drains/Airways    None          Intake/Output Last 24 hours No intake or output data in the 24 hours ending 11/24/20 2015  Labs/Imaging Results for orders placed or performed during the hospital encounter of 11/24/20 (from the past 48 hour(s))  Comprehensive metabolic panel     Status: Abnormal   Collection Time: 11/24/20  4:12 PM  Result Value Ref Range   Sodium 135 135 - 145 mmol/L   Potassium 3.9 3.5 - 5.1 mmol/L   Chloride 104 98 - 111 mmol/L   CO2 22 22 - 32 mmol/L   Glucose, Bld 113 (H) 70 - 99 mg/dL    Comment: Glucose reference range applies only to samples taken after fasting for at least 8 hours.   BUN 18 8 - 23 mg/dL   Creatinine, Ser 0.88 0.61 - 1.24 mg/dL   Calcium 8.5 (L) 8.9 - 10.3 mg/dL   Total Protein 7.0 6.5 - 8.1 g/dL   Albumin 3.8 3.5 - 5.0 g/dL   AST 20 15 - 41 U/L   ALT 25 0 - 44 U/L   Alkaline Phosphatase 68 38 - 126 U/L   Total Bilirubin 0.5 0.3 - 1.2 mg/dL   GFR,  Estimated >60 >60 mL/min    Comment: (NOTE) Calculated using the CKD-EPI Creatinine Equation (2021)    Anion gap 9 5 - 15    Comment: Performed at [facility], [address] [place], [place] [address]  Brain natriuretic peptide     Status: None   Collection Time: 11/24/20  4:12 PM  Result Value Ref Range   B Natriuretic Peptide 61.0 0.0 - 100.0 pg/mL    Comment: Performed at [facility], [address], [place], [place] [address]  Troponin I (High Sensitivity)     Status: None   Collection Time: 11/24/20  4:12 PM  Result Value Ref Range   Troponin I (High Sensitivity) 7 <18 ng/L    Comment: (NOTE) Elevated high sensitivity troponin I (hsTnI) values and significant  changes across serial measurements may suggest ACS but many other  chronic and acute conditions are known to elevate hsTnI  results.  Refer to the "Links" section for chest pain algorithms and additional  guidance. Performed at [facility], [address] [place], [place] [address]   CBC with Differential     Status: Abnormal   Collection Time: 11/24/20  4:12 PM  Result Value Ref Range   WBC 9.6 4.0 - 10.5 K/uL   RBC 5.41 4.22 - 5.81 MIL/uL   Hemoglobin 16.4 13.0 - 17.0 g/dL   HCT 49.5 39.0 - 52.0 %   MCV 91.5 80.0 - 100.0 fL   MCH 30.3 26.0 - 34.0 pg   MCHC 33.1 30.0 - 36.0 g/dL   RDW 13.8 11.5 - 15.5 %   Platelets 195 150 - 400 K/uL   nRBC 0.0 0.0 - 0.2 %   Neutrophils Relative % 61 %   Neutro Abs 6.0 1.7 - 7.7 K/uL   Lymphocytes Relative 20 %   Lymphs Abs 1.9 0.7 - 4.0 K/uL   Monocytes Relative 14 %   Monocytes Absolute 1.4 (H) 0.1 - 1.0 K/uL   Eosinophils Relative 3 %   Eosinophils Absolute 0.3 0.0 - 0.5 K/uL   Basophils Relative 1 %   Basophils Absolute 0.1 0.0 - 0.1 K/uL   Immature Granulocytes 1 %   Abs Immature Granulocytes 0.05 0.00 - 0.07 K/uL    Comment: Performed at [facility], [address] [place], [place] [address]  Resp Panel by RT-PCR (Flu A&B, Covid) Nasopharyngeal Swab     Status: None   Collection Time: 11/24/20  5:10 PM   Specimen: Nasopharyngeal Swab; Nasopharyngeal(NP) swabs in vial transport medium  Result Value Ref Range   SARS Coronavirus 2 by RT PCR NEGATIVE NEGATIVE    Comment: (NOTE) SARS-CoV-2 target nucleic acids are NOT DETECTED.  The SARS-CoV-2 RNA is generally detectable in upper respiratory specimens during the acute phase of infection. The lowest concentration of SARS-CoV-2 viral copies this assay can detect is 138 copies/mL. A negative result does not preclude SARS-Cov-2 infection and should not be used as the sole basis for treatment or other patient management decisions. A negative result may occur with  improper specimen collection/handling, submission of specimen other than nasopharyngeal swab, presence of viral mutation(s) within the areas  targeted by this assay, and inadequate number of viral copies(<138 copies/mL). A negative result must be combined with clinical observations, patient history, and epidemiological information. The expected result is Negative.  Fact Sheet for Patients:  EntrepreneurPulse.com.au  Fact Sheet for Healthcare Providers:  IncredibleEmployment.be  This test is no t yet approved or cleared by the [place] FDA and  has  been authorized for detection and/or diagnosis of SARS-CoV-2 by FDA under an Emergency Use Authorization (EUA). This EUA will remain  in effect (meaning this test can be used) for the duration of the COVID-19 declaration under Section 564(b)(1) of the Act, 21 U.S.C.section 360bbb-3(b)(1), unless the authorization is terminated  or revoked sooner.       Influenza A by PCR NEGATIVE NEGATIVE   Influenza B by PCR NEGATIVE NEGATIVE    Comment: (NOTE) The Xpert Xpress SARS-CoV-2/FLU/RSV plus assay is intended as an aid in the diagnosis of influenza from Nasopharyngeal swab specimens and should not be used as a sole basis for treatment. Nasal washings and aspirates are unacceptable for Xpert Xpress SARS-CoV-2/FLU/RSV testing.  Fact Sheet for Patients: EntrepreneurPulse.com.au  Fact Sheet for Healthcare Providers: IncredibleEmployment.be  This test is not yet approved or cleared by the [place] FDA and has been authorized for detection and/or diagnosis of SARS-CoV-2 by FDA under an Emergency Use Authorization (EUA). This EUA will remain in effect (meaning this test can be used) for the duration of the COVID-19 declaration under Section 564(b)(1) of the Act, 21 U.S.C. section 360bbb-3(b)(1), unless the authorization is terminated or revoked.  Performed at [facility], [address], [place], [place] [address]   Troponin I (High Sensitivity)     Status: None   Collection Time: 11/24/20  6:09 PM   Result Value Ref Range   Troponin I (High Sensitivity) 7 <18 ng/L    Comment: (NOTE) Elevated high sensitivity troponin I (hsTnI) values and significant  changes across serial measurements may suggest ACS but many other  chronic and acute conditions are known to elevate hsTnI results.  Refer to the "Links" section for chest pain algorithms and additional  guidance. Performed at [facility], [address], [place], [place] [address]    CT Angio Chest Pulmonary Embolism (PE) W or WO Contrast  Result Date: 11/24/2020 CLINICAL DATA:  Rule out pulmonary embolus.  DVT. EXAM: CT ANGIOGRAPHY CHEST WITH CONTRAST TECHNIQUE: Multidetector CT imaging of the chest was performed using the standard protocol during bolus administration of intravenous contrast. Multiplanar CT image reconstructions and MIPs were obtained to evaluate the vascular anatomy. CONTRAST:  157mL OMNIPAQUE IOHEXOL 350 MG/ML SOLN COMPARISON:  None. FINDINGS: Cardiovascular: There is a clot within the distal right main pulmonary artery which extends into the right upper lobe are and segmental pulmonary arteries compatible with acute pulmonary embolus. Segmental filling defects within the posterior left lower lobe also noted compatible with acute pulmonary embolus, image 202/5. Filling defects within segmental branches of the right middle lobe pulmonary artery and subsegmental branches of the right lower lobe pulmonary artery are also noted. The heart size appears normal.  The RV to LV ratio is equal to 1.03. Mild aortic atherosclerosis and mild coronary artery calcifications. Mediastinum/Nodes: No enlarged mediastinal, hilar, or axillary lymph nodes. Thyroid gland, trachea, and esophagus demonstrate no significant findings. Small hiatal hernia. Lungs/Pleura: No pleural effusion. No airspace consolidation, atelectasis, or pneumothorax. Upper Abdomen: No acute abnormality within the imaged portions of the upper abdomen. Musculoskeletal:  Degenerative disc disease identified. No acute or suspicious findings. Review of the MIP images confirms the above findings. IMPRESSION: 1. Examination is positive for acute bilateral pulmonary emboli. Positive for acute PE with of right heart strain (RV/LV Ratio 1.03) consistent with at least submassive (intermediate risk) PE. The presence of right heart strain has been associated with an increased risk of morbidity and mortality. 2. Aortic atherosclerosis. Coronary artery calcifications. Aortic Atherosclerosis (  ICD10-I70.0). Critical Value/emergent results were called by telephone at the time of interpretation on 11/24/2020 at 2:57 pm to provider AMY BOYD , who verbally acknowledged these results. Electronically Signed   By: Kerby Moors M.D.   On: 11/24/2020 14:58   US Venous Img Lower Unilateral Left (DVT)  Result Date: 11/24/2020 CLINICAL DATA:  Pain, edema EXAM: LEFT LOWER EXTREMITY VENOUS DOPPLER ULTRASOUND TECHNIQUE: Gray-scale sonography with compression, as well as color and duplex ultrasound, were performed to evaluate the deep venous system(s) from the level of the common femoral vein through the popliteal and proximal calf veins. COMPARISON:  02/17/2005 by report only FINDINGS: VENOUS Normal compressibility of the common femoral, superficial femoral veins. The distal femoral vein is noncompressible with hypoechoic partially occlusive thrombus, with only a small amount of color flow signal identified. Similarly the popliteal vein is mostly occluded by hypoechoic thrombus. Noncompressible soleal, anterior tibial, and posterior tibial veins in the calf. Limited views of the contralateral common femoral vein are unremarkable. OTHER None. Limitations: none IMPRESSION: 1. POSITIVE for occlusive DVT in left calf extending through the popliteal vein into the distal femoral vein. These results will be called to the ordering clinician or representative by the Radiology Department at the imaging location.  Electronically Signed   By: Lucrezia Europe M.D.   On: 11/24/2020 11:54    Pending Labs Unresulted Labs (From admission, onward)   None      Vitals/Pain Today's Vitals   11/24/20 1526 11/24/20 1700 11/24/20 1730 11/24/20 1835  BP:  (!) 153/81 129/68 132/76  Pulse:  71 75 72  Resp:  17 (!) 22 20  Temp:      TempSrc:      SpO2:  99% 98% 98%  Weight: 110.2 kg     Height: 5' 7.5" (1.715 m)     PainSc: 0-No pain       Isolation Precautions No active isolations  Medications Medications - No data to display  Mobility walks Low fall risk   Focused Assessments    R Recommendations: See Admitting Provider Note  Report given to:   Additional Notes:

## 2020-11-25 ENCOUNTER — Encounter (HOSPITAL_COMMUNITY): Payer: Self-pay | Admitting: Internal Medicine

## 2020-11-25 ENCOUNTER — Inpatient Hospital Stay (HOSPITAL_COMMUNITY): Payer: PPO

## 2020-11-25 DIAGNOSIS — I824Y2 Acute embolism and thrombosis of unspecified deep veins of left proximal lower extremity: Secondary | ICD-10-CM

## 2020-11-25 DIAGNOSIS — I2609 Other pulmonary embolism with acute cor pulmonale: Secondary | ICD-10-CM

## 2020-11-25 DIAGNOSIS — I4891 Unspecified atrial fibrillation: Secondary | ICD-10-CM

## 2020-11-25 DIAGNOSIS — I471 Supraventricular tachycardia: Secondary | ICD-10-CM

## 2020-11-25 DIAGNOSIS — I480 Paroxysmal atrial fibrillation: Secondary | ICD-10-CM

## 2020-11-25 LAB — ECHOCARDIOGRAM COMPLETE
AR max vel: 3.3 cm2
AV Area VTI: 3.96 cm2
AV Area mean vel: 3.87 cm2
AV Mean grad: 4 mmHg
AV Peak grad: 8.7 mmHg
Ao pk vel: 1.48 m/s
Area-P 1/2: 3.76 cm2
Height: 67 in
S' Lateral: 3.48 cm
Weight: 3746.06 oz

## 2020-11-25 LAB — CBC
HCT: 50.4 % (ref 39.0–52.0)
Hemoglobin: 16.7 g/dL (ref 13.0–17.0)
MCH: 30.9 pg (ref 26.0–34.0)
MCHC: 33.1 g/dL (ref 30.0–36.0)
MCV: 93.2 fL (ref 80.0–100.0)
Platelets: 195 10*3/uL (ref 150–400)
RBC: 5.41 MIL/uL (ref 4.22–5.81)
RDW: 14 % (ref 11.5–15.5)
WBC: 10 10*3/uL (ref 4.0–10.5)
nRBC: 0 % (ref 0.0–0.2)

## 2020-11-25 LAB — BASIC METABOLIC PANEL
Anion gap: 10 (ref 5–15)
BUN: 15 mg/dL (ref 8–23)
CO2: 22 mmol/L (ref 22–32)
Calcium: 8.4 mg/dL — ABNORMAL LOW (ref 8.9–10.3)
Chloride: 103 mmol/L (ref 98–111)
Creatinine, Ser: 0.77 mg/dL (ref 0.61–1.24)
GFR, Estimated: >60 mL/min (ref >60)
Glucose, Bld: 115 mg/dL — ABNORMAL HIGH (ref 70–99)
Potassium: 3.9 mmol/L (ref 3.5–5.1)
Sodium: 135 mmol/L (ref 135–145)

## 2020-11-25 LAB — APTT
aPTT: 57 seconds — ABNORMAL HIGH (ref 24–36)
aPTT: 53 seconds — ABNORMAL HIGH (ref 24–36)

## 2020-11-25 LAB — HEPARIN LEVEL (UNFRACTIONATED): Heparin Unfractionated: 1.05 IU/mL — ABNORMAL HIGH (ref 0.30–0.70)

## 2020-11-25 LAB — MRSA NEXT GEN BY PCR, NASAL: MRSA by PCR Next Gen: NOT DETECTED

## 2020-11-25 LAB — HIV ANTIBODY (ROUTINE TESTING W REFLEX): HIV Screen 4th Generation wRfx: NONREACTIVE

## 2020-11-25 MED ORDER — DILTIAZEM HCL-DEXTROSE 125-5 MG/125ML-% IV SOLN (PREMIX)
5.0000 mg/h | INTRAVENOUS | Status: DC
  Administered 2020-11-25: 5 mg/h via INTRAVENOUS
  Filled 2020-11-25: qty 125

## 2020-11-25 MED ORDER — DILTIAZEM HCL 30 MG PO TABS
30.0000 mg | ORAL_TABLET | Freq: Three times a day (TID) | ORAL | Status: DC
  Administered 2020-11-25 – 2020-11-26 (×3): 30 mg via ORAL
  Filled 2020-11-25 (×3): qty 1

## 2020-11-25 MED ORDER — PERFLUTREN LIPID MICROSPHERE
1.0000 mL | INTRAVENOUS | Status: AC | PRN
  Administered 2020-11-25 (×2): 2 mL via INTRAVENOUS
  Filled 2020-11-25: qty 10

## 2020-11-25 MED ORDER — CHLORHEXIDINE GLUCONATE CLOTH 2 % EX PADS
6.0000 | MEDICATED_PAD | Freq: Every day | CUTANEOUS | Status: DC
  Administered 2020-11-25 – 2020-11-26 (×2): 6 via TOPICAL

## 2020-11-25 MED ORDER — HYDROCODONE BIT-HOMATROP MBR 5-1.5 MG/5ML PO SOLN
5.0000 mL | Freq: Four times a day (QID) | ORAL | Status: DC | PRN
  Administered 2020-11-25 – 2020-11-27 (×5): 5 mL via ORAL
  Filled 2020-11-25 (×5): qty 5

## 2020-11-25 MED ORDER — HEPARIN BOLUS VIA INFUSION
1500.0000 [IU] | Freq: Once | INTRAVENOUS | Status: AC
  Administered 2020-11-25: 1500 [IU] via INTRAVENOUS
  Filled 2020-11-25: qty 1500

## 2020-11-25 NOTE — Progress Notes (Signed)
ANTICOAGULATION CONSULT NOTE -   Pharmacy Consult for heparin gtt  Indication: pulmonary embolus  Allergies  Allergen Reactions   Codeine Rash    Patient Measurements: Height: 5\' 7"  (170.2 cm) Weight: 106.2 kg (234 lb 2.1 oz) IBW/kg (Calculated) : 66.1 Heparin Dosing Weight: HEPARIN DW (KG): 89.7   Vital Signs: Temp: 98.3 F (36.8 C) (06/16 1000) Temp Source: Oral (06/16 1000) BP: 138/62 (06/16 1800) Pulse Rate: 72 (06/16 1800)  Labs: Recent Labs    11/24/20 1419 11/24/20 1612 11/25/20 0439 11/25/20 1526  HGB  --  16.4 16.7  --   HCT  --  49.5 50.4  --   PLT  --  195 195  --   APTT  --   --  57* 53*  HEPARINUNFRC  --   --  1.05*  --   CREATININE 1.00 0.88 0.77  --      Estimated Creatinine Clearance: 102.6 mL/min (by C-G formula based on SCr of 0.77 mg/dL).   Medical History: Past Medical History:  Diagnosis Date   Complication of anesthesia    difficult intubation   History of gout     Medications:  Medications Prior to Admission  Medication Sig Dispense Refill Last Dose   acetaminophen (TYLENOL) 500 MG tablet Take 500-1,000 mg by mouth every 8 (eight) hours as needed (pain).   Unknown   diphenhydrAMINE (BENADRYL) 25 mg capsule Take 25 mg by mouth daily.   Unknown   sodium chloride (OCEAN) 0.65 % SOLN nasal spray Place 1 spray into both nostrils as needed for congestion.   Unknown   Scheduled:   Chlorhexidine Gluconate Cloth  6 each Topical Daily   diltiazem  30 mg Oral Q8H   heparin  1,500 Units Intravenous Once   Infusions:   heparin 1,600 Units/hr (11/25/20 1503)   PRN: acetaminophen **OR** acetaminophen, guaiFENesin-dextromethorphan, HYDROcodone bit-homatropine, ondansetron **OR** ondansetron (ZOFRAN) IV, polyethylene glycol Anti-infectives (From admission, onward)    None       Assessment: Brian Leach a 70 y.o. male requires anticoagulation with a heparin iv infusion for the indication of  pulmonary embolus. Heparin gtt will be  started following pharmacy protocol per pharmacy consult. Patient had a single dose of apixaban 10mg  today at 1230 per MD that will require aPTT/HL correlation before transitioning to only HL monitoring.   HL 1.05- elevated due to apixaban dose APTT 57>53- subtherapeutic. No issues with heparin per RN  Goal of Therapy:  Heparin level 0.3-0.7 units/ml Aptt 66-102 sec Monitor platelets by anticoagulation protocol: Yes   Plan:  Rebolus 1500 units IV x 1 Increase heparin infusion to 1800 units/hr Check anti-Xa level in 6 hours and daily while on heparin Continue to monitor H&H and platelets.    Margot Ables, PharmD Clinical Pharmacist 11/25/2020 7:09 PM

## 2020-11-25 NOTE — Consult Note (Signed)
Cardiology Consultation:   Patient ID: [name] MRN: [id number]; DOB: 1950/09/27  Admit date: 11/24/2020 Date of Consult: 11/25/2020  PCP:  Curlene Labrum, MD   [facility] Prov[facility] Cardiologist:  New to [facility]   Patient Profile:   Brian Leach is a 70 y.o. male with past medical history of gout and no prior cardiac history who is being seen 11/25/2020 for the evaluation of SVT/Atrial Fibrillation in the setting of a recently diagnosed PE at the request of Dr. Carles Collet.  History of Present Illness:   Brian Leach presented to [facility] ED on 11/24/2020 after outpatient imaging showed a DVT in the left popliteal vein into the distal femoral vein and CTA was positive for acute bilateral PE with evidence of right-heart strain. Initial labs showed WBC 9.6, Hgb 16.4, platelets 195, Na+ 135, K+ 3.9 and creatinine 0.88. COVID negative. Initial Hs Troponin 7 with repeat of 7. EKG on admission showed sinus tachycardia, HR 103 with PAC's.   He was started on IV Heparin and admitted for further monitoring. Overnight, he went into atrial fibrillation with HR in the 120's to 130's around midnight and was started on IV Cardizem. He converted back to NSR around 0400 and has been in NSR since with HR in the 70's to 80's and frequent PAC's.   In talking with the patient today, he reports his only symptom was left leg pain which started last weekend. He reports having muscular pain in the past but says this felt different and when he talked to his PCP, an ultrasound was ordered.  He denies any specific chest pain or dyspnea on exertion.  No recent palpitations, orthopnea, PND or lower extremity edema. Says he is usually active at baseline and was doing yard-work on Monday without any symptoms. He was overall asymptomatic with his arrhythmia last night.  Says he did not sleep well due to his monitor going off continuously.  He denies any known history of cardiac issues such as CAD, CHF or cardiac  arrhythmias.    Past Medical History:  Diagnosis Date   Complication of anesthesia    difficult intubation   History of gout     Past Surgical History:  Procedure Laterality Date   COLONOSCOPY N/A 04/07/2013   Procedure: COLONOSCOPY;  Surgeon: Daneil Dolin, MD;  Location: [facility];  Service: Endoscopy;  Laterality: N/A;  7:30 AM   COLONOSCOPY N/A 05/14/2018   Procedure: COLONOSCOPY;  Surgeon: Daneil Dolin, MD;  Location: [facility];  Service: Endoscopy;  Laterality: N/A;  10:30   KNEE ARTHROSCOPY Left    POLYPECTOMY  05/14/2018   Procedure: POLYPECTOMY;  Surgeon: Daneil Dolin, MD;  Location: [facility];  Service: Endoscopy;;  colon      Home Medications:  Prior to Admission medications   Medication Sig Start Date End Date Taking? Authorizing Provider  acetaminophen (TYLENOL) 500 MG tablet Take 500-1,000 mg by mouth every 8 (eight) hours as needed (pain).    [provider]  diphenhydrAMINE (BENADRYL) 25 mg capsule Take 25 mg by mouth daily.    [provider]  sodium chloride (OCEAN) 0.65 % SOLN nasal spray Place 1 spray into both nostrils as needed for congestion.    [provider]    Inpatient Medications: Scheduled Meds:  Chlorhexidine Gluconate Cloth  6 each Topical Daily   diltiazem  30 mg Oral Q8H   Continuous Infusions:  sodium chloride 100 mL/hr at 11/25/20 0819   heparin  1,600 Units/hr (11/25/20 4585)   PRN Meds: acetaminophen **OR** acetaminophen, guaiFENesin-dextromethorphan, ondansetron **OR** ondansetron (ZOFRAN) IV, polyethylene glycol  Allergies:    Allergies  Allergen Reactions   Codeine Rash    Social History:   Social History   Socioeconomic History   Marital status: Single    Spouse name: Not on file   Number of children: Not on file   Years of education: Not on file   Highest education level: Not on file  Occupational History   Not on file  Tobacco Use   Smoking status: Never   Smokeless  tobacco: Never  Vaping Use   Vaping Use: Never used  Substance and Sexual Activity   Alcohol use: No   Drug use: No   Sexual activity: Yes    Birth control/protection: None  Other Topics Concern   Not on file  Social History Narrative   Not on file   Social Determinants of Health   Financial Resource Strain: Not on file  Food Insecurity: Not on file  Transportation Needs: Not on file  Physical Activity: Not on file  Stress: Not on file  Social Connections: Not on file  Intimate Partner Violence: Not on file    Family History:    Family History  Problem Relation Age of Onset   Heart disease Brother      ROS:  Please see the history of present illness.   All other ROS reviewed and negative.     Physical Exam/Data:   Vitals:   11/25/20 0300 11/25/20 0400 11/25/20 0800 11/25/20 1000  BP: (!) 137/91 (!) 121/59 (!) 150/107   Pulse: 72 (!) 51 77 72  Resp: 20 (!) 22 20 (!) 23  Temp:  98.4 F (36.9 C)  98.3 F (36.8 C)  TempSrc:  Oral  Oral  SpO2: 95% 92% 95% 95%  Weight:      Height:        Intake/Output Summary (Last 24 hours) at 11/25/2020 1058 Last data filed at 11/25/2020 0600 Gross per 24 hour  Intake 937.76 ml  Output 700 ml  Net 237.76 ml   Last 3 Weights 11/24/2020 11/24/2020 05/14/2018  Weight (lbs) 234 lb 2.1 oz 243 lb 223 lb  Weight (kg) 106.2 kg 110.224 kg 101.152 kg     Body mass index is 36.67 kg/m.  General:  Well nourished, well developed male appearing in no acute distress. HEENT: normal Lymph: no adenopathy Neck: no JVD Endocrine:  No thryomegaly Vascular: No carotid bruits; FA pulses 2+ bilaterally without bruits  Cardiac:  normal S1, S2; RRR with frequent ectopic beats.   Lungs: Respirations regular and unlabored. No wheezing. Occasional rhonchi.  Abd: soft, nontender, no hepatomegaly  Ext: no edema Musculoskeletal:  No deformities, BUE and BLE strength normal and equal Skin: warm and dry  Neuro:  CNs 2-12 intact, no focal  abnormalities noted Psych:  Normal affect   EKG:  The EKG was personally reviewed and demonstrates: Sinus tachycardia, HR 103 with PAC's.   Relevant CV Studies:  Echocardiogram: Pending  Laboratory Data:  High Sensitivity Troponin:   Recent Labs  Lab 11/24/20 1612 11/24/20 1809  TROPONINIHS 7 7     Chemistry Recent Labs  Lab 11/24/20 1419 11/24/20 1612 11/25/20 0439  NA  --  135 135  K  --  3.9 3.9  CL  --  104 103  CO2  --  22 22  GLUCOSE  --  113* 115*  BUN  --  18 15  CREATININE 1.00 0.88 0.77  CALCIUM  --  8.5* 8.4*  GFRNONAA  --  >60 >60  ANIONGAP  --  9 10    Recent Labs  Lab 11/24/20 1612  PROT 7.0  ALBUMIN 3.8  AST 20  ALT 25  ALKPHOS 68  BILITOT 0.5   Hematology Recent Labs  Lab 11/24/20 1612 11/25/20 0439  WBC 9.6 10.0  RBC 5.41 5.41  HGB 16.4 16.7  HCT 49.5 50.4  MCV 91.5 93.2  MCH 30.3 30.9  MCHC 33.1 33.1  RDW 13.8 14.0  PLT 195 195   BNP Recent Labs  Lab 11/24/20 1612  BNP 61.0    DDimer No results for input(s): DDIMER in the last 168 hours.   Radiology/Studies:  CT Angio Chest Pulmonary Embolism (PE) W or WO Contrast  Result Date: 11/24/2020 CLINICAL DATA:  Rule out pulmonary embolus.  DVT. EXAM: CT ANGIOGRAPHY CHEST WITH CONTRAST TECHNIQUE: Multidetector CT imaging of the chest was performed using the standard protocol during bolus administration of intravenous contrast. Multiplanar CT image reconstructions and MIPs were obtained to evaluate the vascular anatomy. CONTRAST:  151mL OMNIPAQUE IOHEXOL 350 MG/ML SOLN COMPARISON:  None. FINDINGS: Cardiovascular: There is a clot within the distal right main pulmonary artery which extends into the right upper lobe are and segmental pulmonary arteries compatible with acute pulmonary embolus. Segmental filling defects within the posterior left lower lobe also noted compatible with acute pulmonary embolus, image 202/5. Filling defects within segmental branches of the right middle lobe  pulmonary artery and subsegmental branches of the right lower lobe pulmonary artery are also noted. The heart size appears normal.  The RV to LV ratio is equal to 1.03. Mild aortic atherosclerosis and mild coronary artery calcifications. Mediastinum/Nodes: No enlarged mediastinal, hilar, or axillary lymph nodes. Thyroid gland, trachea, and esophagus demonstrate no significant findings. Small hiatal hernia. Lungs/Pleura: No pleural effusion. No airspace consolidation, atelectasis, or pneumothorax. Upper Abdomen: No acute abnormality within the imaged portions of the upper abdomen. Musculoskeletal: Degenerative disc disease identified. No acute or suspicious findings. Review of the MIP images confirms the above findings. IMPRESSION: 1. Examination is positive for acute bilateral pulmonary emboli. Positive for acute PE with of right heart strain (RV/LV Ratio 1.03) consistent with at least submassive (intermediate risk) PE. The presence of right heart strain has been associated with an increased risk of morbidity and mortality. 2. Aortic atherosclerosis. Coronary artery calcifications. Aortic Atherosclerosis (ICD10-I70.0). Critical Value/emergent results were called by telephone at the time of interpretation on 11/24/2020 at 2:57 pm to provider AMY BOYD , who verbally acknowledged these results. Electronically Signed   By: Kerby Moors M.D.   On: 11/24/2020 14:58   US Venous Img Lower Unilateral Left (DVT)  Result Date: 11/24/2020 CLINICAL DATA:  Pain, edema EXAM: LEFT LOWER EXTREMITY VENOUS DOPPLER ULTRASOUND TECHNIQUE: Gray-scale sonography with compression, as well as color and duplex ultrasound, were performed to evaluate the deep venous system(s) from the level of the common femoral vein through the popliteal and proximal calf veins. COMPARISON:  02/17/2005 by report only FINDINGS: VENOUS Normal compressibility of the common femoral, superficial femoral veins. The distal femoral vein is noncompressible with  hypoechoic partially occlusive thrombus, with only a small amount of color flow signal identified. Similarly the popliteal vein is mostly occluded by hypoechoic thrombus. Noncompressible soleal, anterior tibial, and posterior tibial veins in the calf. Limited views of the contralateral common femoral vein are unremarkable. OTHER None. Limitations: none IMPRESSION: 1. POSITIVE for occlusive DVT in left  calf extending through the popliteal vein into the distal femoral vein. These results will be called to the ordering clinician or representative by the Radiology Department at the imaging location. Electronically Signed   By: Lucrezia Europe M.D.   On: 11/24/2020 11:54     Assessment and Plan:   1. Atrial Fibrillation with RVR - This is a new diagnosis for the patient and occurred in the setting of an acute bilateral PE. He was overall asymptomatic with the episode and converted back to normal sinus rhythm with IV Cardizem within 4 hours of rhythm onset. He is maintaining normal sinus rhythm this morning. Agree with checking TSH along with obtaining an echocardiogram given his new arrhythmia and evidence of right heart strain by CTA. - He is currently on IV Cardizem at 5 mg/hr. Will stop this and transition to PO short-acting Cardizem 30 mg Q8H and anticipate switching to Cardizem CD prior to discharge if EF is normal by echo. - He is already on Heparin for anticoagulation given his newly diagnosed PE/DVT. It is likely that his arrhythmia was triggered by his PE but would consider an outpatient event monitor to see if he needs long-term anticoagulation following treatment of his PE/DVT if found to have recurrent atrial fibrillation.   2. DVT/PE - Dopplers showed a DVT in the left popliteal vein into the distal femoral vein and CTA was positive for acute bilateral PE with evidence of right-heart strain. Further hypercoagulable workup per admitting team. Currently on Heparin.  3. Coronary Calcification by CT -  CTA Chest on admission showed aortic atherosclerosis and coronary artery calcifications. He denies any recent anginal symptoms and hs Troponin values were negative this admission. Echo pending. Would also check FLP for risk stratification. He is not currently on statin therapy.    Risk Assessment/Risk Scores:      This patients CHA2DS2-VASc Score and unadjusted Ischemic Stroke Rate (% per year) is equal to 3.2 % stroke rate/year from a score of 3  Above score calculated as 1 point each if present [CHF, HTN, DM, Vascular=MI/PAD/Aortic Plaque, Age if 65-74, or Male] Above score calculated as 2 points each if present [Age > 75, or Stroke/TIA/TE]     For questions or updates, please contact [facility] Please consult [email or web address] for contact info under    Signed, Erma Heritage, PA-C  11/25/2020 10:58 AM

## 2020-11-25 NOTE — Progress Notes (Signed)
ANTICOAGULATION CONSULT NOTE -   Pharmacy Consult for heparin gtt  Indication: pulmonary embolus  Allergies  Allergen Reactions   Codeine Rash    Patient Measurements: Height: 5\' 7"  (170.2 cm) Weight: 106.2 kg (234 lb 2.1 oz) IBW/kg (Calculated) : 66.1 Heparin Dosing Weight: HEPARIN DW (KG): 89.7   Vital Signs: Temp: 98.4 F (36.9 C) (06/16 0400) Temp Source: Oral (06/16 0400) BP: 121/59 (06/16 0400) Pulse Rate: 51 (06/16 0400)  Labs: Recent Labs    11/24/20 1419 11/24/20 1612 11/25/20 0439  HGB  --  16.4 16.7  HCT  --  49.5 50.4  PLT  --  195 195  APTT  --   --  57*  HEPARINUNFRC  --   --  1.05*  CREATININE 1.00 0.88 0.77     Estimated Creatinine Clearance: 102.6 mL/min (by C-G formula based on SCr of 0.77 mg/dL).   Medical History: Past Medical History:  Diagnosis Date   Complication of anesthesia    difficult intubation   History of gout     Medications:  Medications Prior to Admission  Medication Sig Dispense Refill Last Dose   acetaminophen (TYLENOL) 500 MG tablet Take 500-1,000 mg by mouth every 8 (eight) hours as needed (pain).   Unknown   diphenhydrAMINE (BENADRYL) 25 mg capsule Take 25 mg by mouth daily.   Unknown   sodium chloride (OCEAN) 0.65 % SOLN nasal spray Place 1 spray into both nostrils as needed for congestion.   Unknown   Scheduled:   Chlorhexidine Gluconate Cloth  6 each Topical Daily   Infusions:   sodium chloride 100 mL/hr at 11/24/20 2133   diltiazem (CARDIZEM) infusion 5 mg/hr (11/25/20 0455)   heparin 1,450 Units/hr (11/24/20 2122)   PRN: acetaminophen **OR** acetaminophen, guaiFENesin-dextromethorphan, ondansetron **OR** ondansetron (ZOFRAN) IV, polyethylene glycol Anti-infectives (From admission, onward)    None       Assessment: Brian Leach a 70 y.o. male requires anticoagulation with a heparin iv infusion for the indication of  pulmonary embolus. Heparin gtt will be started following pharmacy protocol per  pharmacy consult. Patient had a single dose of apixaban 10mg  today at 1230 per MD that will require aPTT/HL correlation before transitioning to only HL monitoring.   HL 1.05- elevated due to apixaban dose APTT 57- subtherapeutic   Goal of Therapy:  Heparin level 0.3-0.7 units/ml Aptt 66-102 sec Monitor platelets by anticoagulation protocol: Yes   Plan:  Rebolus 1500 units IV x 1 Increase heparin infusion to 1600 units/hr Check anti-Xa level in 6 hours and daily while on heparin Continue to monitor H&H and platelets.    Margot Ables, PharmD Clinical Pharmacist 11/25/2020 7:50 AM

## 2020-11-25 NOTE — Progress Notes (Signed)
  Echocardiogram 2D Echocardiogram has been performed.  Matilde Bash 11/25/2020, 2:28 PM

## 2020-11-25 NOTE — Progress Notes (Addendum)
PROGRESS NOTE  Brian Leach TWS:[id number] DOB: 1950/09/16 DOA: 11/24/2020 PCP: Curlene Labrum, MD  Brief History:  70 year old male with a history of gouty arthritis presenting with 4 to 5-day history of left-sided calf pain and dyspnea on exertion.  The patient went to see his PCP on 11/24/2020 because of the above symptoms.  A venous duplex was ordered and was noted to be positive for a left calf DVT.  Subsequently, his PCP ordered a CTA chest to be performed on the same day when his venous duplex came back positive for DVT.  CTA of the chest revealed bilateral pulmonary emboli with RV strain with RV/LV ratio 1.03.  Subsequently, the patient was advised to go to the emergency department for further evaluation and treatment.  The patient himself denies any fevers, chills, chest pain, nausea, vomiting or diarrhea.  There is been no dizziness or syncope.  He has not had any palpitations.  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  The patient was started on IV heparin.  During his stay in the stepdown unit, the patient developed SVT/atrial fibrillation.  He was started on a diltiazem drip on the evening of 11/25/2019.  Assessment/Plan: Acute pulmonary embolus -CTA chest as discussed above -unprovoked by clinical history -He was noted to have RV strain -Echocardiogram -Continue IV heparin  SVT/atrial fibrillation -Check TSH -Echo -transition to po diltiazem in next 24 hours -Cardiology consult  Class 2 Obesity -BMI 36.67 -lifestyle modification      Status is: Inpatient  Remains inpatient appropriate because:Ongoing diagnostic testing needed not appropriate for outpatient work up  Dispo: The patient is from: Home              Anticipated d/c is to: Home              Patient currently is not medically stable to d/c.   Difficult to place patient Non        Family Communication:   no Family at bedside  Consultants:   cardiology  Code Status:  FULL   DVT Prophylaxis:  IV Heparin    Procedures: As Listed in Progress Note Above  Antibiotics: None        Subjective: Patient denies fevers, chills, headache, chest pain, dyspnea, nausea, vomiting, diarrhea, abdominal pain, dysuria, hematuria, hematochezia, and melena.   Objective: Vitals:   11/25/20 0000 11/25/20 0100 11/25/20 0300 11/25/20 0400  BP: 135/60 (!) 158/89 (!) 137/91 (!) 121/59  Pulse: 71 61 72 (!) 51  Resp: (!) 26 (!) 26 20 (!) 22  Temp:    98.4 F (36.9 C)  TempSrc:    Oral  SpO2: 95% 96% 95% 92%  Weight:      Height:        Intake/Output Summary (Last 24 hours) at 11/25/2020 0017 Last data filed at 11/25/2020 0600 Gross per 24 hour  Intake 937.76 ml  Output 700 ml  Net 237.76 ml   Weight change:  Exam:  General:  Pt is alert, follows commands appropriately, not in acute distress HEENT: No icterus, No thrush, No neck mass, [place]/AT Cardiovascular: RRR, S1/S2, no rubs, no gallops Respiratory: bibasilar rales. No wheeze Abdomen: Soft/+BS, non tender, non distended, no guarding Extremities: No edema, No lymphangitis, No petechiae, No rashes, no synovitis   Data Reviewed: I have personally reviewed following labs and imaging studies Basic Metabolic Panel: Recent Labs  Lab  11/24/20 1419 11/24/20 1612 11/25/20 0439  NA  --  135 135  K  --  3.9 3.9  CL  --  104 103  CO2  --  22 22  GLUCOSE  --  113* 115*  BUN  --  18 15  CREATININE 1.00 0.88 0.77  CALCIUM  --  8.5* 8.4*   Liver Function Tests: Recent Labs  Lab 11/24/20 1612  AST 20  ALT 25  ALKPHOS 68  BILITOT 0.5  PROT 7.0  ALBUMIN 3.8   No results for input(s): LIPASE, AMYLASE in the last 168 hours. No results for input(s): AMMONIA in the last 168 hours. Coagulation Profile: No results for input(s): INR, PROTIME in the last 168 hours. CBC: Recent Labs  Lab 11/24/20 1612 11/25/20 0439  WBC 9.6 10.0  NEUTROABS 6.0  --   HGB 16.4 16.7  HCT  49.5 50.4  MCV 91.5 93.2  PLT 195 195   Cardiac Enzymes: No results for input(s): CKTOTAL, CKMB, CKMBINDEX, TROPONINI in the last 168 hours. BNP: Invalid input(s): POCBNP CBG: No results for input(s): GLUCAP in the last 168 hours. HbA1C: No results for input(s): HGBA1C in the last 72 hours. Urine analysis: No results found for: COLORURINE, APPEARANCEUR, LABSPEC, PHURINE, GLUCOSEU, HGBUR, BILIRUBINUR, KETONESUR, PROTEINUR, UROBILINOGEN, NITRITE, LEUKOCYTESUR Sepsis Labs: @LABRCNTIP (procalcitonin:4,lacticidven:4) ) Recent Results (from the past 240 hour(s))  Resp Panel by RT-PCR (Flu A&B, Covid) Nasopharyngeal Swab     Status: None   Collection Time: 11/24/20  5:10 PM   Specimen: Nasopharyngeal Swab; Nasopharyngeal(NP) swabs in vial transport medium  Result Value Ref Range Status   SARS Coronavirus 2 by RT PCR NEGATIVE NEGATIVE Final    Comment: (NOTE) SARS-CoV-2 target nucleic acids are NOT DETECTED.  The SARS-CoV-2 RNA is generally detectable in upper respiratory specimens during the acute phase of infection. The lowest concentration of SARS-CoV-2 viral copies this assay can detect is 138 copies/mL. A negative result does not preclude SARS-Cov-2 infection and should not be used as the sole basis for treatment or other patient management decisions. A negative result may occur with  improper specimen collection/handling, submission of specimen other than nasopharyngeal swab, presence of viral mutation(s) within the areas targeted by this assay, and inadequate number of viral copies(<138 copies/mL). A negative result must be combined with clinical observations, patient history, and epidemiological information. The expected result is Negative.  Fact Sheet for Patients:  EntrepreneurPulse.com.au  Fact Sheet for Healthcare Providers:  IncredibleEmployment.be  This test is no t yet approved or cleared by the [place] FDA and  has been  authorized for detection and/or diagnosis of SARS-CoV-2 by FDA under an Emergency Use Authorization (EUA). This EUA will remain  in effect (meaning this test can be used) for the duration of the COVID-19 declaration under Section 564(b)(1) of the Act, 21 U.S.C.section 360bbb-3(b)(1), unless the authorization is terminated  or revoked sooner.       Influenza A by PCR NEGATIVE NEGATIVE Final   Influenza B by PCR NEGATIVE NEGATIVE Final    Comment: (NOTE) The Xpert Xpress SARS-CoV-2/FLU/RSV plus assay is intended as an aid in the diagnosis of influenza from Nasopharyngeal swab specimens and should not be used as a sole basis for treatment. Nasal washings and aspirates are unacceptable for Xpert Xpress SARS-CoV-2/FLU/RSV testing.  Fact Sheet for Patients: EntrepreneurPulse.com.au  Fact Sheet for Healthcare Providers: IncredibleEmployment.be  This test is not yet approved or cleared by the [place] FDA and has been authorized for detection and/or diagnosis of SARS-CoV-2  by FDA under an Emergency Use Authorization (EUA). This EUA will remain in effect (meaning this test can be used) for the duration of the COVID-19 declaration under Section 564(b)(1) of the Act, 21 U.S.C. section 360bbb-3(b)(1), unless the authorization is terminated or revoked.  Performed at [facility], [address], [place], [place] [address]   MRSA Next Gen by PCR, Nasal     Status: None   Collection Time: 11/24/20  9:29 PM   Specimen: Nasal Mucosa; Nasal Swab  Result Value Ref Range Status   MRSA by PCR Next Gen NOT DETECTED NOT DETECTED Final    Comment: (NOTE) The GeneXpert MRSA Assay (FDA approved for NASAL specimens only), is one component of a comprehensive MRSA colonization surveillance program. It is not intended to diagnose MRSA infection nor to guide or monitor treatment for MRSA infections. Test performance is not FDA approved in patients less than 51  years old. Performed at [facility], [address], [place], [place] [address]      Scheduled Meds:  Chlorhexidine Gluconate Cloth  6 each Topical Daily   heparin  1,500 Units Intravenous Once   Continuous Infusions:  sodium chloride 100 mL/hr at 11/25/20 0819   diltiazem (CARDIZEM) infusion 5 mg/hr (11/25/20 0757)   heparin 1,450 Units/hr (11/24/20 2122)    Procedures/Studies: CT Angio Chest Pulmonary Embolism (PE) W or WO Contrast  Result Date: 11/24/2020 CLINICAL DATA:  Rule out pulmonary embolus.  DVT. EXAM: CT ANGIOGRAPHY CHEST WITH CONTRAST TECHNIQUE: Multidetector CT imaging of the chest was performed using the standard protocol during bolus administration of intravenous contrast. Multiplanar CT image reconstructions and MIPs were obtained to evaluate the vascular anatomy. CONTRAST:  164mL OMNIPAQUE IOHEXOL 350 MG/ML SOLN COMPARISON:  None. FINDINGS: Cardiovascular: There is a clot within the distal right main pulmonary artery which extends into the right upper lobe are and segmental pulmonary arteries compatible with acute pulmonary embolus. Segmental filling defects within the posterior left lower lobe also noted compatible with acute pulmonary embolus, image 202/5. Filling defects within segmental branches of the right middle lobe pulmonary artery and subsegmental branches of the right lower lobe pulmonary artery are also noted. The heart size appears normal.  The RV to LV ratio is equal to 1.03. Mild aortic atherosclerosis and mild coronary artery calcifications. Mediastinum/Nodes: No enlarged mediastinal, hilar, or axillary lymph nodes. Thyroid gland, trachea, and esophagus demonstrate no significant findings. Small hiatal hernia. Lungs/Pleura: No pleural effusion. No airspace consolidation, atelectasis, or pneumothorax. Upper Abdomen: No acute abnormality within the imaged portions of the upper abdomen. Musculoskeletal: Degenerative disc disease identified. No acute or suspicious  findings. Review of the MIP images confirms the above findings. IMPRESSION: 1. Examination is positive for acute bilateral pulmonary emboli. Positive for acute PE with of right heart strain (RV/LV Ratio 1.03) consistent with at least submassive (intermediate risk) PE. The presence of right heart strain has been associated with an increased risk of morbidity and mortality. 2. Aortic atherosclerosis. Coronary artery calcifications. Aortic Atherosclerosis (ICD10-I70.0). Critical Value/emergent results were called by telephone at the time of interpretation on 11/24/2020 at 2:57 pm to provider AMY BOYD , who verbally acknowledged these results. Electronically Signed   By: Kerby Moors M.D.   On: 11/24/2020 14:58   US Venous Img Lower Unilateral Left (DVT)  Result Date: 11/24/2020 CLINICAL DATA:  Pain, edema EXAM: LEFT LOWER EXTREMITY VENOUS DOPPLER ULTRASOUND TECHNIQUE: Gray-scale sonography with compression, as well as color and duplex ultrasound, were performed to evaluate the deep venous system(s)  from the level of the common femoral vein through the popliteal and proximal calf veins. COMPARISON:  02/17/2005 by report only FINDINGS: VENOUS Normal compressibility of the common femoral, superficial femoral veins. The distal femoral vein is noncompressible with hypoechoic partially occlusive thrombus, with only a small amount of color flow signal identified. Similarly the popliteal vein is mostly occluded by hypoechoic thrombus. Noncompressible soleal, anterior tibial, and posterior tibial veins in the calf. Limited views of the contralateral common femoral vein are unremarkable. OTHER None. Limitations: none IMPRESSION: 1. POSITIVE for occlusive DVT in left calf extending through the popliteal vein into the distal femoral vein. These results will be called to the ordering clinician or representative by the Radiology Department at the imaging location. Electronically Signed   By: Lucrezia Europe M.D.   On: 11/24/2020  11:54    Orson Eva, DO  [facility]  If 7PM-7AM, please contact night-coverage [email or web address] Password [email or web address] 11/25/2020, 8:21 AM   LOS: 1 day

## 2020-11-25 NOTE — Progress Notes (Signed)
1:07 AM RN called due to patient went into paroxysmal atrial fibrillation.  Patient was already on IV heparin drip.  IV Cardizem drip was started.

## 2020-11-26 DIAGNOSIS — I480 Paroxysmal atrial fibrillation: Secondary | ICD-10-CM

## 2020-11-26 LAB — LIPID PANEL
Cholesterol: 186 mg/dL (ref 0–200)
HDL: 55 mg/dL (ref >40)
LDL Cholesterol: 111 mg/dL — ABNORMAL HIGH (ref 0–99)
Total CHOL/HDL Ratio: 3.4 RATIO
Triglycerides: 100 mg/dL (ref <150)
VLDL: 20 mg/dL (ref 0–40)

## 2020-11-26 LAB — BASIC METABOLIC PANEL
Anion gap: 8 (ref 5–15)
BUN: 16 mg/dL (ref 8–23)
CO2: 22 mmol/L (ref 22–32)
Calcium: 8.3 mg/dL — ABNORMAL LOW (ref 8.9–10.3)
Chloride: 104 mmol/L (ref 98–111)
Creatinine, Ser: 0.95 mg/dL (ref 0.61–1.24)
GFR, Estimated: >60 mL/min (ref >60)
Glucose, Bld: 126 mg/dL — ABNORMAL HIGH (ref 70–99)
Potassium: 4 mmol/L (ref 3.5–5.1)
Sodium: 134 mmol/L — ABNORMAL LOW (ref 135–145)

## 2020-11-26 LAB — CBC
HCT: 48.6 % (ref 39.0–52.0)
Hemoglobin: 15.9 g/dL (ref 13.0–17.0)
MCH: 30.6 pg (ref 26.0–34.0)
MCHC: 32.7 g/dL (ref 30.0–36.0)
MCV: 93.5 fL (ref 80.0–100.0)
Platelets: 209 10*3/uL (ref 150–400)
RBC: 5.2 MIL/uL (ref 4.22–5.81)
RDW: 13.8 % (ref 11.5–15.5)
WBC: 10.3 10*3/uL (ref 4.0–10.5)
nRBC: 0 % (ref 0.0–0.2)

## 2020-11-26 LAB — HEPARIN LEVEL (UNFRACTIONATED): Heparin Unfractionated: 0.51 IU/mL (ref 0.30–0.70)

## 2020-11-26 LAB — APTT
aPTT: 73 seconds — ABNORMAL HIGH (ref 24–36)
aPTT: 70 seconds — ABNORMAL HIGH (ref 24–36)

## 2020-11-26 LAB — T4, FREE: Free T4: 1.07 ng/dL (ref 0.61–1.12)

## 2020-11-26 LAB — TSH: TSH: 2.891 u[IU]/mL (ref 0.350–4.500)

## 2020-11-26 LAB — MAGNESIUM: Magnesium: 2.2 mg/dL (ref 1.7–2.4)

## 2020-11-26 MED ORDER — DILTIAZEM HCL ER COATED BEADS 180 MG PO CP24
180.0000 mg | ORAL_CAPSULE | Freq: Every day | ORAL | Status: DC
  Administered 2020-11-26 – 2020-11-27 (×2): 180 mg via ORAL
  Filled 2020-11-26 (×2): qty 1

## 2020-11-26 MED ORDER — ROSUVASTATIN CALCIUM 10 MG PO TABS
10.0000 mg | ORAL_TABLET | Freq: Every day | ORAL | Status: DC
  Administered 2020-11-26 (×2): 10 mg via ORAL
  Filled 2020-11-26 (×2): qty 1

## 2020-11-26 MED ORDER — ALPRAZOLAM 0.25 MG PO TABS
0.2500 mg | ORAL_TABLET | Freq: Two times a day (BID) | ORAL | Status: DC | PRN
  Administered 2020-11-26: 0.25 mg via ORAL
  Filled 2020-11-26: qty 1

## 2020-11-26 NOTE — Care Management Important Message (Signed)
Important Message  Patient Details  Name: Brian Leach MRN: [id number] Date of Birth: Jul 01, 1950   Medicare Important Message Given:  Yes     Tommy Medal 11/26/2020, 2:12 PM

## 2020-11-26 NOTE — Progress Notes (Addendum)
Progress Note  Patient Name: Brian Leach Date of Encounter: 11/26/2020  [facility] Cardiologist: New to [facility] this admission   Subjective   Breathing at baseline. No chest pain or palpitations. Pain along his left leg has improved.   Inpatient Medications    Scheduled Meds:  Chlorhexidine Gluconate Cloth  6 each Topical Daily   diltiazem  30 mg Oral Q8H   Continuous Infusions:  heparin 1,800 Units/hr (11/26/20 0515)   PRN Meds: acetaminophen **OR** acetaminophen, guaiFENesin-dextromethorphan, HYDROcodone bit-homatropine, ondansetron **OR** ondansetron (ZOFRAN) IV, polyethylene glycol   Vital Signs    Vitals:   11/26/20 0400 11/26/20 0500 11/26/20 0603 11/26/20 0737  BP: 129/84 (!) 166/110 (!) 148/72   Pulse: (!) 59 60  66  Resp: (!) 21 19  20   Temp:  97.6 F (36.4 C)  (!) 97.2 F (36.2 C)  TempSrc:  Oral  Oral  SpO2: 91% 91%  94%  Weight:      Height:        Intake/Output Summary (Last 24 hours) at 11/26/2020 0745 Last data filed at 11/26/2020 0515 Gross per 24 hour  Intake 1963.19 ml  Output 1300 ml  Net 663.19 ml   Last 3 Weights 11/24/2020 11/24/2020 05/14/2018  Weight (lbs) 234 lb 2.1 oz 243 lb 223 lb  Weight (kg) 106.2 kg 110.224 kg 101.152 kg      Telemetry    NSR, HR in 60-80's. Frequent PAC's and occasional PVC's. Personally Reviewed  ECG    No new tracings.   Physical Exam   GEN: Appears in no acute distress.   Neck: No JVD Cardiac: RRR with occasional ectopic beats. No murmurs, rubs, or gallops.  Respiratory: Clear to auscultation bilaterally. GI: Soft, nontender, non-distended  MS: No edema; No deformity. Neuro:  Nonfocal  Psych: Normal affect   Labs    High Sensitivity Troponin:   Recent Labs  Lab 11/24/20 1612 11/24/20 1809  TROPONINIHS 7 7      Chemistry Recent Labs  Lab 11/24/20 1612 11/25/20 0439 11/26/20 0540  NA 135 135 134*  K 3.9 3.9 4.0  CL 104 103 104  CO2 22 22 22   GLUCOSE 113* 115* 126*  BUN 18 15  16   CREATININE 0.88 0.77 0.95  CALCIUM 8.5* 8.4* 8.3*  PROT 7.0  --   --   ALBUMIN 3.8  --   --   AST 20  --   --   ALT 25  --   --   ALKPHOS 68  --   --   BILITOT 0.5  --   --   GFRNONAA >60 >60 >60  ANIONGAP 9 10 8      Hematology Recent Labs  Lab 11/24/20 1612 11/25/20 0439 11/26/20 0540  WBC 9.6 10.0 10.3  RBC 5.41 5.41 5.20  HGB 16.4 16.7 15.9  HCT 49.5 50.4 48.6  MCV 91.5 93.2 93.5  MCH 30.3 30.9 30.6  MCHC 33.1 33.1 32.7  RDW 13.8 14.0 13.8  PLT 195 195 209    BNP Recent Labs  Lab 11/24/20 1612  BNP 61.0     DDimer No results for input(s): DDIMER in the last 168 hours.   Radiology    CT Angio Chest Pulmonary Embolism (PE) W or WO Contrast  Result Date: 11/24/2020 CLINICAL DATA:  Rule out pulmonary embolus.  DVT. EXAM: CT ANGIOGRAPHY CHEST WITH CONTRAST TECHNIQUE: Multidetector CT imaging of the chest was performed using the standard protocol during bolus administration of intravenous contrast. Multiplanar CT  image reconstructions and MIPs were obtained to evaluate the vascular anatomy. CONTRAST:  183mL OMNIPAQUE IOHEXOL 350 MG/ML SOLN COMPARISON:  None. FINDINGS: Cardiovascular: There is a clot within the distal right main pulmonary artery which extends into the right upper lobe are and segmental pulmonary arteries compatible with acute pulmonary embolus. Segmental filling defects within the posterior left lower lobe also noted compatible with acute pulmonary embolus, image 202/5. Filling defects within segmental branches of the right middle lobe pulmonary artery and subsegmental branches of the right lower lobe pulmonary artery are also noted. The heart size appears normal.  The RV to LV ratio is equal to 1.03. Mild aortic atherosclerosis and mild coronary artery calcifications. Mediastinum/Nodes: No enlarged mediastinal, hilar, or axillary lymph nodes. Thyroid gland, trachea, and esophagus demonstrate no significant findings. Small hiatal hernia. Lungs/Pleura: No  pleural effusion. No airspace consolidation, atelectasis, or pneumothorax. Upper Abdomen: No acute abnormality within the imaged portions of the upper abdomen. Musculoskeletal: Degenerative disc disease identified. No acute or suspicious findings. Review of the MIP images confirms the above findings. IMPRESSION: 1. Examination is positive for acute bilateral pulmonary emboli. Positive for acute PE with of right heart strain (RV/LV Ratio 1.03) consistent with at least submassive (intermediate risk) PE. The presence of right heart strain has been associated with an increased risk of morbidity and mortality. 2. Aortic atherosclerosis. Coronary artery calcifications. Aortic Atherosclerosis (ICD10-I70.0). Critical Value/emergent results were called by telephone at the time of interpretation on 11/24/2020 at 2:57 pm to provider AMY BOYD , who verbally acknowledged these results. Electronically Signed   By: Kerby Moors M.D.   On: 11/24/2020 14:58   US Venous Img Lower Unilateral Left (DVT)  Result Date: 11/24/2020 CLINICAL DATA:  Pain, edema EXAM: LEFT LOWER EXTREMITY VENOUS DOPPLER ULTRASOUND TECHNIQUE: Gray-scale sonography with compression, as well as color and duplex ultrasound, were performed to evaluate the deep venous system(s) from the level of the common femoral vein through the popliteal and proximal calf veins. COMPARISON:  02/17/2005 by report only FINDINGS: VENOUS Normal compressibility of the common femoral, superficial femoral veins. The distal femoral vein is noncompressible with hypoechoic partially occlusive thrombus, with only a small amount of color flow signal identified. Similarly the popliteal vein is mostly occluded by hypoechoic thrombus. Noncompressible soleal, anterior tibial, and posterior tibial veins in the calf. Limited views of the contralateral common femoral vein are unremarkable. OTHER None. Limitations: none IMPRESSION: 1. POSITIVE for occlusive DVT in left calf extending through  the popliteal vein into the distal femoral vein. These results will be called to the ordering clinician or representative by the Radiology Department at the imaging location. Electronically Signed   By: Lucrezia Europe M.D.   On: 11/24/2020 11:54    Cardiac Studies   Echocardiogram: 11/25/2020 IMPRESSIONS     1. Left ventricular ejection fraction, by estimation, is 60 to 65%. The  left ventricle has normal function. The left ventricle has no regional  wall motion abnormalities. There is mild left ventricular hypertrophy.  Left ventricular diastolic parameters  are indeterminate.   2. Right ventricular systolic function was not well visualized. The right  ventricular size is not well visualized.   3. The mitral valve is normal in structure. No evidence of mitral valve  regurgitation. No evidence of mitral stenosis.   4. The aortic valve has an indeterminant number of cusps. Aortic valve  regurgitation is not visualized. No aortic stenosis is present.   Patient Profile     70 y.o. male with past  medical history of gout and no prior cardiac history who is currently admitted for an acute PE/DVT. Cardiology consulted for new-onset atrial fibrillation with RVR.   Assessment & Plan    1. Atrial Fibrillation with RVR - He had transient atrial fibrillation this admission and returned to NSR within 4 hours following initiation of IV Cardizem. Was switched to oral short-acting Cardizem yesterday and tolerated well. Will transition to Cardizem CD dosing at 180mg  daily.  - This patients CHA2DS2-VASc Score and unadjusted Ischemic Stroke Rate (% per year) is equal to 2.2 % stroke rate/year from a score of 2. He is currently on Heparin with plans to switch to oral anticoagulation for treatment of his DVT/PE.  - Would consider an outpatient event monitor to assess for recurrence as he was asymptomatic with his arrhythmia this admission and this would help determine if long-term anticoagulation is indicated.     2. DVT/PE - Dopplers prior to admission were positive for a DVT and CTA was positive for acute bilateral PE with evidence of right-heart strain. Hypercoagulable workup per the admitting team. Remains on Heparin with plans to transition to oral anticoagulation.    3. Coronary Calcification by CT - CTA Chest on admission showed aortic atherosclerosis and coronary artery calcifications. No reported anginal symptoms and Hs Troponin values were negative this admission. Echo shows a preserved EF with no regional WMA. FLP does show LDL is elevated to 111. Would recommend initiation of statin therapy. Can start Crestor 10mg  daily and recheck FLP and LFT's in 2 months.     He lives in [place] and will arrange for follow-up in our [place] office.   For questions or updates, please contact [facility] Please consult [email or web address] for contact info under        Signed, Erma Heritage, PA-C  11/26/2020, 7:45 AM    Attending note Patient seen and discussed with PA Ahmed Prima, I agree with her documentation. Admitted with DVT/PE as well as new onset afib. Self converted back to SR, agree with consolidationg his dilt to long acting 180mg  daily. Will require anticoag for both DVT/PE and afib, converted IV heparin to oral eliquis DVT/PE dosing when ok from primary team standpoint. From primary ream appears to have been unprovoked PE which would require lifelong anticoagulation. If in the future by some change it was decided that lifelong anticoag was not needed from a DVT/PE standpoint would recommend cardiac monitor to evaluate for any recurrent afib before stopping anticoag.   We will sign off inpatient care. We will arrange f/u  Carlyle Dolly MD

## 2020-11-26 NOTE — Progress Notes (Signed)
ANTICOAGULATION CONSULT NOTE -   Pharmacy Consult for heparin gtt  Indication: pulmonary embolus  Allergies  Allergen Reactions   Codeine Rash    Patient Measurements: Height: 5\' 7"  (170.2 cm) Weight: 106.2 kg (234 lb 2.1 oz) IBW/kg (Calculated) : 66.1 Heparin Dosing Weight: HEPARIN DW (KG): 89.7   Vital Signs: Temp: 97.2 F (36.2 C) (06/17 0737) Temp Source: Oral (06/17 0737) BP: 148/72 (06/17 0603) Pulse Rate: 66 (06/17 0737)  Labs: Recent Labs    11/24/20 1612 11/24/20 1612 11/25/20 0439 11/25/20 1526 11/26/20 0050 11/26/20 0540  HGB 16.4  --  16.7  --   --  15.9  HCT 49.5  --  50.4  --   --  48.6  PLT 195  --  195  --   --  209  APTT  --    < > 57* 53* 73* 70*  HEPARINUNFRC  --   --  1.05*  --   --  0.51  CREATININE 0.88  --  0.77  --   --  0.95   < > = values in this interval not displayed.     Estimated Creatinine Clearance: 86.4 mL/min (by C-G formula based on SCr of 0.95 mg/dL).   Medical History: Past Medical History:  Diagnosis Date   Complication of anesthesia    difficult intubation   History of gout     Medications:  Medications Prior to Admission  Medication Sig Dispense Refill Last Dose   acetaminophen (TYLENOL) 500 MG tablet Take 500-1,000 mg by mouth every 8 (eight) hours as needed (pain).   Unknown   diphenhydrAMINE (BENADRYL) 25 mg capsule Take 25 mg by mouth daily.   Unknown   sodium chloride (OCEAN) 0.65 % SOLN nasal spray Place 1 spray into both nostrils as needed for congestion.   Unknown   Scheduled:   Chlorhexidine Gluconate Cloth  6 each Topical Daily   diltiazem  180 mg Oral Daily   rosuvastatin  10 mg Oral q1800   Infusions:   heparin 1,800 Units/hr (11/26/20 0515)   PRN: acetaminophen **OR** acetaminophen, guaiFENesin-dextromethorphan, HYDROcodone bit-homatropine, ondansetron **OR** ondansetron (ZOFRAN) IV, polyethylene glycol Anti-infectives (From admission, onward)    None       Assessment: Brian Leach a  70 y.o. male requires anticoagulation with a heparin iv infusion for the indication of  pulmonary embolus. Heparin gtt will be started following pharmacy protocol per pharmacy consult. Patient had a single dose of apixaban 10mg  today at 1230 per MD that will require aPTT/HL correlation before transitioning to only HL monitoring.   6/16 HL 1.05- elevated due to apixaban dose , now HL 0.51 6/17 APTT 57>53> 73> 70- therapeutic. No issues with heparin per RN  Goal of Therapy:  Heparin level 0.3-0.7 units/ml Aptt 66-102 sec Monitor platelets by anticoagulation protocol: Yes   Plan:  Cont heparin at 1800 units/hr Check anti-Xa level  and APTT daily while on heparin Continue to monitor H&H and platelets.  F/U transition to po therapy  Isac Sarna, BS [place], [place] Clinical Pharmacist Pager [phone] 11/26/2020 8:50 AM

## 2020-11-26 NOTE — Progress Notes (Signed)
[place] for heparin gtt  Indication: pulmonary embolus, DVT  Allergies  Allergen Reactions   Codeine Rash    Patient Measurements: Height: 5\' 7"  (170.2 cm) Weight: 106.2 kg (234 lb 2.1 oz) IBW/kg (Calculated) : 66.1 Heparin Dosing Weight: HEPARIN DW (KG): 89.7   Vital Signs: Temp: 98.8 F (37.1 C) (06/17 0030) Temp Source: Oral (06/17 0030) BP: 142/91 (06/16 2300) Pulse Rate: 73 (06/16 2300)  Labs: Recent Labs    11/24/20 1419 11/24/20 1612 11/25/20 0439 11/25/20 1526 11/26/20 0050  HGB  --  16.4 16.7  --   --   HCT  --  49.5 50.4  --   --   PLT  --  195 195  --   --   APTT  --   --  57* 53* 73*  HEPARINUNFRC  --   --  1.05*  --   --   CREATININE 1.00 0.88 0.77  --   --      Estimated Creatinine Clearance: 102.6 mL/min (by C-G formula based on SCr of 0.77 mg/dL).   Medical History: Past Medical History:  Diagnosis Date   Complication of anesthesia    difficult intubation   History of gout     Medications:  Medications Prior to Admission  Medication Sig Dispense Refill Last Dose   acetaminophen (TYLENOL) 500 MG tablet Take 500-1,000 mg by mouth every 8 (eight) hours as needed (pain).   Unknown   diphenhydrAMINE (BENADRYL) 25 mg capsule Take 25 mg by mouth daily.   Unknown   sodium chloride (OCEAN) 0.65 % SOLN nasal spray Place 1 spray into both nostrils as needed for congestion.   Unknown   Scheduled:   Chlorhexidine Gluconate Cloth  6 each Topical Daily   diltiazem  30 mg Oral Q8H   Infusions:   heparin 1,800 Units/hr (11/25/20 1919)   PRN: acetaminophen **OR** acetaminophen, guaiFENesin-dextromethorphan, HYDROcodone bit-homatropine, ondansetron **OR** ondansetron (ZOFRAN) IV, polyethylene glycol Anti-infectives (From admission, onward)    None       Assessment: Brian Leach a 70 y.o. male requires anticoagulation with a heparin iv infusion for the indication of  pulmonary embolus. Heparin gtt will  be started following pharmacy protocol per pharmacy consult. Patient had a single dose of apixaban 10mg  today at 1230 per MD that will require aPTT/HL correlation before transitioning to only HL monitoring.   HL 1.05- elevated due to apixaban dose APTT 57>53- subtherapeutic. No issues with heparin per RN  6/17 AM update:  aPTT therapeutic after rate increase  Goal of Therapy:  Heparin level 0.3-0.7 units/ml Aptt 66-102 sec Monitor platelets by anticoagulation protocol: Yes   Plan:  Cont heparin at 1800 units/hr Confirmatory aPTT with AM labs  Narda Bonds, PharmD, [place] Pharmacist Phone: [phone]

## 2020-11-26 NOTE — Progress Notes (Addendum)
PROGRESS NOTE  Brian Leach AVW:[id number] DOB: 10/31/1950 DOA: 11/24/2020 PCP: Curlene Labrum, MD  Brief History:  70 year old male with a history of gouty arthritis presenting with 4 to 5-day history of left-sided calf pain and dyspnea on exertion.  The patient went to see his PCP on 11/24/2020 because of the above symptoms.  A venous duplex was ordered and was noted to be positive for a left calf DVT.  Subsequently, his PCP ordered a CTA chest to be performed on the same day when his venous duplex came back positive for DVT.  CTA of the chest revealed bilateral pulmonary emboli with RV strain with RV/LV ratio 1.03.  Subsequently, the patient was advised to go to the emergency department for further evaluation and treatment.  The patient himself denies any fevers, chills, chest pain, nausea, vomiting or diarrhea.  There is been no dizziness or syncope.  He has not had any palpitations.  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  The patient was started on IV heparin.  During his stay in the stepdown unit, the patient developed SVT/atrial fibrillation.  He was started on a diltiazem drip on the evening of 11/25/2019.   Assessment/Plan: Acute pulmonary embolus -CTA chest as discussed above -unprovoked by clinical history -He was noted to have RV strain on CTA chest -Echo EF 60-65%, no WMA -Continue IV heparin -Factor V Leiden -Lupus anticogulant -Prothrombin gene mutation   atrial fibrillation with RVR, type unspecified -Check TSH--2.891 -Echo as above -transition to po diltiazem CD -Cardiology consult appreciated   Class 2 Obesity -BMI 36.67 -lifestyle modification  Anxiety -start prn xanax           Status is: Inpatient   Remains inpatient appropriate because:Ongoing diagnostic testing needed not appropriate for outpatient work up   Dispo: The patient is from: Home              Anticipated d/c is to: Home               Patient currently is not medically stable to d/c.              Difficult to place patient Non          Total time spent 35 minutes.  Greater than 50% spent face to face counseling and coordinating care.      Family Communication:   spouse updated 11/26/20   Consultants:  cardiology   Code Status:  FULL   DVT Prophylaxis:  IV Heparin     Procedures: As Listed in Progress Note Above   Antibiotics: None  Subjective: Patient denies fevers, chills, headache, chest pain, dyspnea, nausea, vomiting, diarrhea, abdominal pain, dysuria, hematuria, hematochezia, and melena.   Objective: Vitals:   11/26/20 1100 11/26/20 1200 11/26/20 1205 11/26/20 1300  BP: (!) 131/92   132/67  Pulse: 98 76 72 98  Resp: (!) 24 (!) 24 16   Temp:   (!) 97.4 F (36.3 C)   TempSrc:   Oral   SpO2: 93% 95% 95% 95%  Weight:      Height:        Intake/Output Summary (Last 24 hours) at 11/26/2020 1619 Last data filed at 11/26/2020 1319 Gross per 24 hour  Intake 761.92 ml  Output 1150 ml  Net -388.08 ml   Weight change:  Exam:  General:  Pt is alert, follows commands appropriately, not  in acute distress HEENT: No icterus, No thrush, No neck mass, [place]/AT Cardiovascular: IRRR, S1/S2, no rubs, no gallops Respiratory: CTA bilaterally, no wheezing, no crackles, no rhonchi Abdomen: Soft/+BS, non tender, non distended, no guarding Extremities: trace LE edema, No lymphangitis, No petechiae, No rashes, no synovitis   Data Reviewed: I have personally reviewed following labs and imaging studies Basic Metabolic Panel: Recent Labs  Lab 11/24/20 1419 11/24/20 1612 11/25/20 0439 11/26/20 0540  NA  --  135 135 134*  K  --  3.9 3.9 4.0  CL  --  104 103 104  CO2  --  22 22 22   GLUCOSE  --  113* 115* 126*  BUN  --  18 15 16   CREATININE 1.00 0.88 0.77 0.95  CALCIUM  --  8.5* 8.4* 8.3*  MG  --   --   --  2.2   Liver Function Tests: Recent Labs  Lab 11/24/20 1612  AST 20  ALT 25  ALKPHOS 68   BILITOT 0.5  PROT 7.0  ALBUMIN 3.8   No results for input(s): LIPASE, AMYLASE in the last 168 hours. No results for input(s): AMMONIA in the last 168 hours. Coagulation Profile: No results for input(s): INR, PROTIME in the last 168 hours. CBC: Recent Labs  Lab 11/24/20 1612 11/25/20 0439 11/26/20 0540  WBC 9.6 10.0 10.3  NEUTROABS 6.0  --   --   HGB 16.4 16.7 15.9  HCT 49.5 50.4 48.6  MCV 91.5 93.2 93.5  PLT 195 195 209   Cardiac Enzymes: No results for input(s): CKTOTAL, CKMB, CKMBINDEX, TROPONINI in the last 168 hours. BNP: Invalid input(s): POCBNP CBG: No results for input(s): GLUCAP in the last 168 hours. HbA1C: No results for input(s): HGBA1C in the last 72 hours. Urine analysis: No results found for: COLORURINE, APPEARANCEUR, LABSPEC, PHURINE, GLUCOSEU, HGBUR, BILIRUBINUR, KETONESUR, PROTEINUR, UROBILINOGEN, NITRITE, LEUKOCYTESUR Sepsis Labs: @LABRCNTIP (procalcitonin:4,lacticidven:4) ) Recent Results (from the past 240 hour(s))  Resp Panel by RT-PCR (Flu A&B, Covid) Nasopharyngeal Swab     Status: None   Collection Time: 11/24/20  5:10 PM   Specimen: Nasopharyngeal Swab; Nasopharyngeal(NP) swabs in vial transport medium  Result Value Ref Range Status   SARS Coronavirus 2 by RT PCR NEGATIVE NEGATIVE Final    Comment: (NOTE) SARS-CoV-2 target nucleic acids are NOT DETECTED.  The SARS-CoV-2 RNA is generally detectable in upper respiratory specimens during the acute phase of infection. The lowest concentration of SARS-CoV-2 viral copies this assay can detect is 138 copies/mL. A negative result does not preclude SARS-Cov-2 infection and should not be used as the sole basis for treatment or other patient management decisions. A negative result may occur with  improper specimen collection/handling, submission of specimen other than nasopharyngeal swab, presence of viral mutation(s) within the areas targeted by this assay, and inadequate number of viral copies(<138  copies/mL). A negative result must be combined with clinical observations, patient history, and epidemiological information. The expected result is Negative.  Fact Sheet for Patients:  EntrepreneurPulse.com.au  Fact Sheet for Healthcare Providers:  IncredibleEmployment.be  This test is no t yet approved or cleared by the [place] FDA and  has been authorized for detection and/or diagnosis of SARS-CoV-2 by FDA under an Emergency Use Authorization (EUA). This EUA will remain  in effect (meaning this test can be used) for the duration of the COVID-19 declaration under Section 564(b)(1) of the Act, 21 U.S.C.section 360bbb-3(b)(1), unless the authorization is terminated  or revoked sooner.  Influenza A by PCR NEGATIVE NEGATIVE Final   Influenza B by PCR NEGATIVE NEGATIVE Final    Comment: (NOTE) The Xpert Xpress SARS-CoV-2/FLU/RSV plus assay is intended as an aid in the diagnosis of influenza from Nasopharyngeal swab specimens and should not be used as a sole basis for treatment. Nasal washings and aspirates are unacceptable for Xpert Xpress SARS-CoV-2/FLU/RSV testing.  Fact Sheet for Patients: EntrepreneurPulse.com.au  Fact Sheet for Healthcare Providers: IncredibleEmployment.be  This test is not yet approved or cleared by the [place] FDA and has been authorized for detection and/or diagnosis of SARS-CoV-2 by FDA under an Emergency Use Authorization (EUA). This EUA will remain in effect (meaning this test can be used) for the duration of the COVID-19 declaration under Section 564(b)(1) of the Act, 21 U.S.C. section 360bbb-3(b)(1), unless the authorization is terminated or revoked.  Performed at [facility], [address] [place], [place] [address]   MRSA Next Gen by PCR, Nasal     Status: None   Collection Time: 11/24/20  9:29 PM   Specimen: Nasal Mucosa; Nasal Swab  Result Value Ref  Range Status   MRSA by PCR Next Gen NOT DETECTED NOT DETECTED Final    Comment: (NOTE) The GeneXpert MRSA Assay (FDA approved for NASAL specimens only), is one component of a comprehensive MRSA colonization surveillance program. It is not intended to diagnose MRSA infection nor to guide or monitor treatment for MRSA infections. Test performance is not FDA approved in patients less than 71 years old. Performed at [facility], [address] [place], [place] [address]      Scheduled Meds:  Chlorhexidine Gluconate Cloth  6 each Topical Daily   diltiazem  180 mg Oral Daily   rosuvastatin  10 mg Oral q1800   Continuous Infusions:  heparin 1,800 Units/hr (11/26/20 1319)    Procedures/Studies: CT Angio Chest Pulmonary Embolism (PE) W or WO Contrast  Result Date: 11/24/2020 CLINICAL DATA:  Rule out pulmonary embolus.  DVT. EXAM: CT ANGIOGRAPHY CHEST WITH CONTRAST TECHNIQUE: Multidetector CT imaging of the chest was performed using the standard protocol during bolus administration of intravenous contrast. Multiplanar CT image reconstructions and MIPs were obtained to evaluate the vascular anatomy. CONTRAST:  171mL OMNIPAQUE IOHEXOL 350 MG/ML SOLN COMPARISON:  None. FINDINGS: Cardiovascular: There is a clot within the distal right main pulmonary artery which extends into the right upper lobe are and segmental pulmonary arteries compatible with acute pulmonary embolus. Segmental filling defects within the posterior left lower lobe also noted compatible with acute pulmonary embolus, image 202/5. Filling defects within segmental branches of the right middle lobe pulmonary artery and subsegmental branches of the right lower lobe pulmonary artery are also noted. The heart size appears normal.  The RV to LV ratio is equal to 1.03. Mild aortic atherosclerosis and mild coronary artery calcifications. Mediastinum/Nodes: No enlarged mediastinal, hilar, or axillary lymph nodes. Thyroid gland, trachea, and  esophagus demonstrate no significant findings. Small hiatal hernia. Lungs/Pleura: No pleural effusion. No airspace consolidation, atelectasis, or pneumothorax. Upper Abdomen: No acute abnormality within the imaged portions of the upper abdomen. Musculoskeletal: Degenerative disc disease identified. No acute or suspicious findings. Review of the MIP images confirms the above findings. IMPRESSION: 1. Examination is positive for acute bilateral pulmonary emboli. Positive for acute PE with of right heart strain (RV/LV Ratio 1.03) consistent with at least submassive (intermediate risk) PE. The presence of right heart strain has been associated with an increased risk of morbidity and mortality. 2. Aortic atherosclerosis. Coronary artery  calcifications. Aortic Atherosclerosis (ICD10-I70.0). Critical Value/emergent results were called by telephone at the time of interpretation on 11/24/2020 at 2:57 pm to provider AMY BOYD , who verbally acknowledged these results. Electronically Signed   By: Kerby Moors M.D.   On: 11/24/2020 14:58   US Venous Img Lower Unilateral Left (DVT)  Result Date: 11/24/2020 CLINICAL DATA:  Pain, edema EXAM: LEFT LOWER EXTREMITY VENOUS DOPPLER ULTRASOUND TECHNIQUE: Gray-scale sonography with compression, as well as color and duplex ultrasound, were performed to evaluate the deep venous system(s) from the level of the common femoral vein through the popliteal and proximal calf veins. COMPARISON:  02/17/2005 by report only FINDINGS: VENOUS Normal compressibility of the common femoral, superficial femoral veins. The distal femoral vein is noncompressible with hypoechoic partially occlusive thrombus, with only a small amount of color flow signal identified. Similarly the popliteal vein is mostly occluded by hypoechoic thrombus. Noncompressible soleal, anterior tibial, and posterior tibial veins in the calf. Limited views of the contralateral common femoral vein are unremarkable. OTHER None.  Limitations: none IMPRESSION: 1. POSITIVE for occlusive DVT in left calf extending through the popliteal vein into the distal femoral vein. These results will be called to the ordering clinician or representative by the Radiology Department at the imaging location. Electronically Signed   By: Lucrezia Europe M.D.   On: 11/24/2020 11:54   ECHOCARDIOGRAM COMPLETE  Result Date: 11/25/2020    ECHOCARDIOGRAM REPORT   Patient Name:   Brian Leach Date of Exam: 11/25/2020 Medical Rec #:  [id number]       Height:       67.0 in Accession #:    [id number]      Weight:       234.1 lb Date of Birth:  1950-08-27       BSA:          2.162 m Patient Age:    89 years        BP:           118/56 mmHg Patient Gender: M               HR:           69 bpm. Exam Location:  [facility] Procedure: 2D Echo, Cardiac Doppler, Color Doppler and Intracardiac            Opacification Agent Indications:    Pulmonary embolus  History:        Patient has no prior history of Echocardiogram examinations.                 DVT, Arrythmias:Atrial Fibrillation, Signs/Symptoms:Pulmonary                 embolus; Risk Factors:Obesity.  Sonographer:    Dustin Flock RDCS Referring Phys: [phone] Leanne Chang [facility]  Sonographer Comments: Patient is morbidly obese. Image acquisition challenging due to respiratory motion and Image acquisition challenging due to patient body habitus. IMPRESSIONS  1. Left ventricular ejection fraction, by estimation, is 60 to 65%. The left ventricle has normal function. The left ventricle has no regional wall motion abnormalities. There is mild left ventricular hypertrophy. Left ventricular diastolic parameters are indeterminate.  2. Right ventricular systolic function was not well visualized. The right ventricular size is not well visualized.  3. The mitral valve is normal in structure. No evidence of mitral valve regurgitation. No evidence of mitral stenosis.  4. The aortic valve has an indeterminant number of cusps. Aortic  valve regurgitation is not visualized. No  aortic stenosis is present. FINDINGS  Left Ventricle: Left ventricular ejection fraction, by estimation, is 60 to 65%. The left ventricle has normal function. The left ventricle has no regional wall motion abnormalities. Definity contrast agent was given IV to delineate the left ventricular  endocardial borders. The left ventricular internal cavity size was normal in size. There is mild left ventricular hypertrophy. Left ventricular diastolic parameters are indeterminate. Right Ventricle: The right ventricular size is not well visualized. Right vetricular wall thickness was not well visualized. Right ventricular systolic function was not well visualized. Left Atrium: Left atrial size was not well visualized. Right Atrium: Right atrial size was not well visualized. Pericardium: There is no evidence of pericardial effusion. Mitral Valve: The mitral valve is normal in structure. No evidence of mitral valve regurgitation. No evidence of mitral valve stenosis. Tricuspid Valve: The tricuspid valve is normal in structure. Tricuspid valve regurgitation is not demonstrated. No evidence of tricuspid stenosis. Aortic Valve: The aortic valve has an indeterminant number of cusps. Aortic valve regurgitation is not visualized. No aortic stenosis is present. Aortic valve mean gradient measures 4.0 mmHg. Aortic valve peak gradient measures 8.7 mmHg. Aortic valve area, by VTI measures 3.96 cm. Pulmonic Valve: The pulmonic valve was not well visualized. Pulmonic valve regurgitation is not visualized. No evidence of pulmonic stenosis. Aorta: The aortic root is normal in size and structure. IAS/Shunts: The interatrial septum was not well visualized.  LEFT VENTRICLE PLAX 2D LVIDd:         5.62 cm  Diastology LVIDs:         3.48 cm  LV e' medial:    8.24 cm/s LV PW:         1.32 cm  LV E/e' medial:  10.1 LV IVS:        1.30 cm  LV e' lateral:   9.64 cm/s LVOT diam:     2.50 cm  LV E/e' lateral:  8.7 LV SV:         89 LV SV Index:   41 LVOT Area:     4.91 cm  RIGHT VENTRICLE RV Basal diam:  3.94 cm RV S prime:     12.40 cm/s LEFT ATRIUM             Index       RIGHT ATRIUM           Index LA diam:        3.60 cm 1.66 cm/m  RA Area:     20.50 cm LA Vol (A2C):   76.0 ml 35.15 ml/m RA Volume:   62.60 ml  28.95 ml/m LA Vol (A4C):   86.6 ml 40.05 ml/m LA Biplane Vol: 89.3 ml 41.30 ml/m  AORTIC VALVE AV Area (Vmax):    3.30 cm AV Area (Vmean):   3.87 cm AV Area (VTI):     3.96 cm AV Vmax:           147.68 cm/s AV Vmean:          90.188 cm/s AV VTI:            0.225 m AV Peak Grad:      8.7 mmHg AV Mean Grad:      4.0 mmHg LVOT Vmax:         99.30 cm/s LVOT Vmean:        71.100 cm/s LVOT VTI:          0.182 m LVOT/AV VTI ratio: 0.81  AORTA Ao Root diam:  3.20 cm MITRAL VALVE MV Area (PHT): 3.76 cm    SHUNTS MV Decel Time: 202 msec    Systemic VTI:  0.18 m MV E velocity: 83.40 cm/s  Systemic Diam: 2.50 cm MV A velocity: 79.60 cm/s MV E/A ratio:  1.05 Carlyle Dolly MD Electronically signed by Carlyle Dolly MD Signature Date/Time: 11/25/2020/4:28:06 PM    Final     Orson Eva, DO  [facility]  If 7PM-7AM, please contact night-coverage [email or web address] Password [email or web address] 11/26/2020, 4:19 PM   LOS: 2 days

## 2020-11-27 DIAGNOSIS — I2694 Multiple subsegmental pulmonary emboli without acute cor pulmonale: Secondary | ICD-10-CM

## 2020-11-27 LAB — HEPARIN LEVEL (UNFRACTIONATED): Heparin Unfractionated: 0.36 IU/mL (ref 0.30–0.70)

## 2020-11-27 LAB — CBC
HCT: 52.4 % — ABNORMAL HIGH (ref 39.0–52.0)
Hemoglobin: 17.3 g/dL — ABNORMAL HIGH (ref 13.0–17.0)
MCH: 30.9 pg (ref 26.0–34.0)
MCHC: 33 g/dL (ref 30.0–36.0)
MCV: 93.6 fL (ref 80.0–100.0)
Platelets: 233 10*3/uL (ref 150–400)
RBC: 5.6 MIL/uL (ref 4.22–5.81)
RDW: 14.1 % (ref 11.5–15.5)
WBC: 10.2 10*3/uL (ref 4.0–10.5)
nRBC: 0 % (ref 0.0–0.2)

## 2020-11-27 LAB — APTT: aPTT: 68 seconds — ABNORMAL HIGH (ref 24–36)

## 2020-11-27 MED ORDER — APIXABAN 5 MG PO TABS
10.0000 mg | ORAL_TABLET | Freq: Two times a day (BID) | ORAL | Status: DC
  Administered 2020-11-27: 10 mg via ORAL
  Filled 2020-11-27: qty 2

## 2020-11-27 MED ORDER — ROSUVASTATIN CALCIUM 10 MG PO TABS
10.0000 mg | ORAL_TABLET | Freq: Every day | ORAL | 1 refills | Status: AC
Start: 2020-11-27 — End: ?

## 2020-11-27 MED ORDER — PANTOPRAZOLE SODIUM 40 MG PO TBEC: 40.0000 mg | DELAYED_RELEASE_TABLET | Freq: Every day | ORAL | 1 refills | Status: DC

## 2020-11-27 MED ORDER — APIXABAN 5 MG PO TABS: 5.0000 mg | ORAL_TABLET | Freq: Two times a day (BID) | ORAL | Status: DC

## 2020-11-27 MED ORDER — DILTIAZEM HCL ER COATED BEADS 240 MG PO CP24: 240.0000 mg | ORAL_CAPSULE | Freq: Every day | ORAL | 1 refills | Status: DC

## 2020-11-27 MED ORDER — APIXABAN 5 MG PO TABS: 10.0000 mg | ORAL_TABLET | Freq: Two times a day (BID) | ORAL | 0 refills | Status: DC

## 2020-11-27 NOTE — Discharge Summary (Signed)
Physician Discharge Summary  Brian Leach DTO:[id number] DOB: [birth year] DOA: 11/24/2020  PCP: Curlene Labrum, MD  Admit date: 11/24/2020 Discharge date: 11/27/2020  Admitted From: Home Disposition:  Home   Recommendations for Outpatient Follow-up:  Follow up with PCP in 1-2 weeks Please obtain BMP/CBC in one week    Discharge Condition: Stable CODE STATUS:FULL Diet recommendation: Heart Healthy    Brief/Interim Summary: 70 year old male with a history of gouty arthritis presenting with 4 to 5-day history of left-sided calf pain and dyspnea on exertion.  The patient went to see his PCP on 11/24/2020 because of the above symptoms.  A venous duplex was ordered and was noted to be positive for a left calf DVT.  Subsequently, his PCP ordered a CTA chest to be performed on the same day when his venous duplex came back positive for DVT.  CTA of the chest revealed bilateral pulmonary emboli with RV strain with RV/LV ratio 1.03.  Subsequently, the patient was advised to go to the emergency department for further evaluation and treatment.  The patient himself denies any fevers, chills, chest pain, nausea, vomiting or diarrhea.  There is been no dizziness or syncope.  He has not had any palpitations.  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  The patient was started on IV heparin.  During his stay in the stepdown unit, the patient developed SVT/atrial fibrillation.  He was started on a diltiazem drip on the evening of 11/25/2019.  He was subsequently transitioned to po diltiazem.  Cardiology was consulted to assist.  Discharge Diagnoses:   Acute pulmonary embolus -CTA chest as discussed above -unprovoked by clinical history -He was noted to have RV strain on CTA chest -Echo EF 60-65%, no WMA; RV not well visualized -Continue IV heparin>>apixaban 10 mg bid x 7 days, then 5 mg bid -Factor V Leiden--pending at time of d/c -Lupus  anticogulant--pending -Prothrombin gene mutation--pending   atrial fibrillation with RVR, type unspecified -Check TSH--2.891 -Echo as above -transition to po diltiazem CD>>increased to 240 mg daily as HR remained in low 100s-110 -Cardiology consult appreciated   Class 2 Obesity -BMI 36.67 -lifestyle modification   Anxiety -start prn xanax during the hospitalization -f/u PCP after d/c  Discharge Instructions   Allergies as of 11/27/2020       Reactions   Codeine Rash        Medication List     STOP taking these medications    acetaminophen 500 MG tablet Commonly known as: TYLENOL       TAKE these medications    apixaban 5 MG Tabs tablet Commonly known as: ELIQUIS Take 2 tablets (10 mg total) by mouth 2 (two) times daily. What changed: how much to take   diltiazem 240 MG 24 hr capsule Commonly known as: CARDIZEM CD Take 1 capsule (240 mg total) by mouth daily. Start taking on: November 28, 2020   pantoprazole 40 MG tablet Commonly known as: Protonix Take 1 tablet (40 mg total) by mouth daily.   rosuvastatin 10 MG tablet Commonly known as: CRESTOR Take 1 tablet (10 mg total) by mouth daily at 6 PM.        Follow-up Information     Verta Ellen., NP Follow up on 12/23/2020.   Specialty: Cardiology Why: [facility]iology [facility] Follow-up on 12/23/2020 at 8:30 AM. Contact information: [place] [address] [phone]  Allergies  Allergen Reactions   Codeine Rash    Consultations: cardiology   Procedures/Studies: CT Angio Chest Pulmonary Embolism (PE) W or WO Contrast  Result Date: 11/24/2020 CLINICAL DATA:  Rule out pulmonary embolus.  DVT. EXAM: CT ANGIOGRAPHY CHEST WITH CONTRAST TECHNIQUE: Multidetector CT imaging of the chest was performed using the standard protocol during bolus administration of intravenous contrast. Multiplanar CT image reconstructions and MIPs were obtained to evaluate the  vascular anatomy. CONTRAST:  171mL OMNIPAQUE IOHEXOL 350 MG/ML SOLN COMPARISON:  None. FINDINGS: Cardiovascular: There is a clot within the distal right main pulmonary artery which extends into the right upper lobe are and segmental pulmonary arteries compatible with acute pulmonary embolus. Segmental filling defects within the posterior left lower lobe also noted compatible with acute pulmonary embolus, image 202/5. Filling defects within segmental branches of the right middle lobe pulmonary artery and subsegmental branches of the right lower lobe pulmonary artery are also noted. The heart size appears normal.  The RV to LV ratio is equal to 1.03. Mild aortic atherosclerosis and mild coronary artery calcifications. Mediastinum/Nodes: No enlarged mediastinal, hilar, or axillary lymph nodes. Thyroid gland, trachea, and esophagus demonstrate no significant findings. Small hiatal hernia. Lungs/Pleura: No pleural effusion. No airspace consolidation, atelectasis, or pneumothorax. Upper Abdomen: No acute abnormality within the imaged portions of the upper abdomen. Musculoskeletal: Degenerative disc disease identified. No acute or suspicious findings. Review of the MIP images confirms the above findings. IMPRESSION: 1. Examination is positive for acute bilateral pulmonary emboli. Positive for acute PE with of right heart strain (RV/LV Ratio 1.03) consistent with at least submassive (intermediate risk) PE. The presence of right heart strain has been associated with an increased risk of morbidity and mortality. 2. Aortic atherosclerosis. Coronary artery calcifications. Aortic Atherosclerosis (ICD10-I70.0). Critical Value/emergent results were called by telephone at the time of interpretation on 11/24/2020 at 2:57 pm to provider AMY BOYD , who verbally acknowledged these results. Electronically Signed   By: Kerby Moors M.D.   On: 11/24/2020 14:58   US Venous Img Lower Unilateral Left (DVT)  Result Date:  11/24/2020 CLINICAL DATA:  Pain, edema EXAM: LEFT LOWER EXTREMITY VENOUS DOPPLER ULTRASOUND TECHNIQUE: Gray-scale sonography with compression, as well as color and duplex ultrasound, were performed to evaluate the deep venous system(s) from the level of the common femoral vein through the popliteal and proximal calf veins. COMPARISON:  02/17/2005 by report only FINDINGS: VENOUS Normal compressibility of the common femoral, superficial femoral veins. The distal femoral vein is noncompressible with hypoechoic partially occlusive thrombus, with only a small amount of color flow signal identified. Similarly the popliteal vein is mostly occluded by hypoechoic thrombus. Noncompressible soleal, anterior tibial, and posterior tibial veins in the calf. Limited views of the contralateral common femoral vein are unremarkable. OTHER None. Limitations: none IMPRESSION: 1. POSITIVE for occlusive DVT in left calf extending through the popliteal vein into the distal femoral vein. These results will be called to the ordering clinician or representative by the Radiology Department at the imaging location. Electronically Signed   By: Lucrezia Europe M.D.   On: 11/24/2020 11:54   ECHOCARDIOGRAM COMPLETE  Result Date: 11/25/2020    ECHOCARDIOGRAM REPORT   Patient Name:   MONICO SUDDUTH Date of Exam: 11/25/2020 Medical Rec #:  [id number]       Height:       67.0 in Accession #:    [id number]      Weight:       234.1 lb Date of Birth:  Nov 08, 1950       BSA:          2.162 m Patient Age:    64 years        BP:           118/56 mmHg Patient Gender: M               HR:           69 bpm. Exam Location:  [facility] Procedure: 2D Echo, Cardiac Doppler, Color Doppler and Intracardiac            Opacification Agent Indications:    Pulmonary embolus  History:        Patient has no prior history of Echocardiogram examinations.                 DVT, Arrythmias:Atrial Fibrillation, Signs/Symptoms:Pulmonary                 embolus; Risk Factors:Obesity.   Sonographer:    Dustin Flock RDCS Referring Phys: [phone] Leanne Chang [facility]  Sonographer Comments: Patient is morbidly obese. Image acquisition challenging due to respiratory motion and Image acquisition challenging due to patient body habitus. IMPRESSIONS  1. Left ventricular ejection fraction, by estimation, is 60 to 65%. The left ventricle has normal function. The left ventricle has no regional wall motion abnormalities. There is mild left ventricular hypertrophy. Left ventricular diastolic parameters are indeterminate.  2. Right ventricular systolic function was not well visualized. The right ventricular size is not well visualized.  3. The mitral valve is normal in structure. No evidence of mitral valve regurgitation. No evidence of mitral stenosis.  4. The aortic valve has an indeterminant number of cusps. Aortic valve regurgitation is not visualized. No aortic stenosis is present. FINDINGS  Left Ventricle: Left ventricular ejection fraction, by estimation, is 60 to 65%. The left ventricle has normal function. The left ventricle has no regional wall motion abnormalities. Definity contrast agent was given IV to delineate the left ventricular  endocardial borders. The left ventricular internal cavity size was normal in size. There is mild left ventricular hypertrophy. Left ventricular diastolic parameters are indeterminate. Right Ventricle: The right ventricular size is not well visualized. Right vetricular wall thickness was not well visualized. Right ventricular systolic function was not well visualized. Left Atrium: Left atrial size was not well visualized. Right Atrium: Right atrial size was not well visualized. Pericardium: There is no evidence of pericardial effusion. Mitral Valve: The mitral valve is normal in structure. No evidence of mitral valve regurgitation. No evidence of mitral valve stenosis. Tricuspid Valve: The tricuspid valve is normal in structure. Tricuspid valve regurgitation is not  demonstrated. No evidence of tricuspid stenosis. Aortic Valve: The aortic valve has an indeterminant number of cusps. Aortic valve regurgitation is not visualized. No aortic stenosis is present. Aortic valve mean gradient measures 4.0 mmHg. Aortic valve peak gradient measures 8.7 mmHg. Aortic valve area, by VTI measures 3.96 cm. Pulmonic Valve: The pulmonic valve was not well visualized. Pulmonic valve regurgitation is not visualized. No evidence of pulmonic stenosis. Aorta: The aortic root is normal in size and structure. IAS/Shunts: The interatrial septum was not well visualized.  LEFT VENTRICLE PLAX 2D LVIDd:         5.62 cm  Diastology LVIDs:         3.48 cm  LV e' medial:    8.24 cm/s LV PW:         1.32 cm  LV E/e' medial:  10.1  LV IVS:        1.30 cm  LV e' lateral:   9.64 cm/s LVOT diam:     2.50 cm  LV E/e' lateral: 8.7 LV SV:         89 LV SV Index:   41 LVOT Area:     4.91 cm  RIGHT VENTRICLE RV Basal diam:  3.94 cm RV S prime:     12.40 cm/s LEFT ATRIUM             Index       RIGHT ATRIUM           Index LA diam:        3.60 cm 1.66 cm/m  RA Area:     20.50 cm LA Vol (A2C):   76.0 ml 35.15 ml/m RA Volume:   62.60 ml  28.95 ml/m LA Vol (A4C):   86.6 ml 40.05 ml/m LA Biplane Vol: 89.3 ml 41.30 ml/m  AORTIC VALVE AV Area (Vmax):    3.30 cm AV Area (Vmean):   3.87 cm AV Area (VTI):     3.96 cm AV Vmax:           147.68 cm/s AV Vmean:          90.188 cm/s AV VTI:            0.225 m AV Peak Grad:      8.7 mmHg AV Mean Grad:      4.0 mmHg LVOT Vmax:         99.30 cm/s LVOT Vmean:        71.100 cm/s LVOT VTI:          0.182 m LVOT/AV VTI ratio: 0.81  AORTA Ao Root diam: 3.20 cm MITRAL VALVE MV Area (PHT): 3.76 cm    SHUNTS MV Decel Time: 202 msec    Systemic VTI:  0.18 m MV E velocity: 83.40 cm/s  Systemic Diam: 2.50 cm MV A velocity: 79.60 cm/s MV E/A ratio:  1.05 Carlyle Dolly MD Electronically signed by Carlyle Dolly MD Signature Date/Time: 11/25/2020/4:28:06 PM    Final          Discharge Exam: Vitals:   11/27/20 1102 11/27/20 1200  BP: 127/76 139/87  Pulse: (!) 45 (!) 129  Resp: (!) 21 16  Temp:    SpO2: 95% (!) 89%   Vitals:   11/27/20 1000 11/27/20 1059 11/27/20 1102 11/27/20 1200  BP: 116/76  127/76 139/87  Pulse: 80 93 (!) 45 (!) 129  Resp: (!) 31 20 (!) 21 16  Temp:  (!) 97.3 F (36.3 C)    TempSrc:  Oral    SpO2: 94% 96% 95% (!) 89%  Weight:      Height:        General: Pt is alert, awake, not in acute distress Cardiovascular: IRRR, S1/S2 +, no rubs, no gallops Respiratory: bibasilar crackles. No wheeze Abdominal: Soft, NT, ND, bowel sounds + Extremities: no edema, no cyanosis   The results of significant diagnostics from this hospitalization (including imaging, microbiology, ancillary and laboratory) are listed below for reference.    Significant Diagnostic Studies: CT Angio Chest Pulmonary Embolism (PE) W or WO Contrast  Result Date: 11/24/2020 CLINICAL DATA:  Rule out pulmonary embolus.  DVT. EXAM: CT ANGIOGRAPHY CHEST WITH CONTRAST TECHNIQUE: Multidetector CT imaging of the chest was performed using the standard protocol during bolus administration of intravenous contrast. Multiplanar CT image reconstructions and MIPs were obtained to evaluate the vascular anatomy. CONTRAST:  129mL OMNIPAQUE IOHEXOL 350 MG/ML SOLN COMPARISON:  None. FINDINGS: Cardiovascular: There is a clot within the distal right main pulmonary artery which extends into the right upper lobe are and segmental pulmonary arteries compatible with acute pulmonary embolus. Segmental filling defects within the posterior left lower lobe also noted compatible with acute pulmonary embolus, image 202/5. Filling defects within segmental branches of the right middle lobe pulmonary artery and subsegmental branches of the right lower lobe pulmonary artery are also noted. The heart size appears normal.  The RV to LV ratio is equal to 1.03. Mild aortic atherosclerosis and mild  coronary artery calcifications. Mediastinum/Nodes: No enlarged mediastinal, hilar, or axillary lymph nodes. Thyroid gland, trachea, and esophagus demonstrate no significant findings. Small hiatal hernia. Lungs/Pleura: No pleural effusion. No airspace consolidation, atelectasis, or pneumothorax. Upper Abdomen: No acute abnormality within the imaged portions of the upper abdomen. Musculoskeletal: Degenerative disc disease identified. No acute or suspicious findings. Review of the MIP images confirms the above findings. IMPRESSION: 1. Examination is positive for acute bilateral pulmonary emboli. Positive for acute PE with of right heart strain (RV/LV Ratio 1.03) consistent with at least submassive (intermediate risk) PE. The presence of right heart strain has been associated with an increased risk of morbidity and mortality. 2. Aortic atherosclerosis. Coronary artery calcifications. Aortic Atherosclerosis (ICD10-I70.0). Critical Value/emergent results were called by telephone at the time of interpretation on 11/24/2020 at 2:57 pm to provider AMY BOYD , who verbally acknowledged these results. Electronically Signed   By: Kerby Moors M.D.   On: 11/24/2020 14:58   US Venous Img Lower Unilateral Left (DVT)  Result Date: 11/24/2020 CLINICAL DATA:  Pain, edema EXAM: LEFT LOWER EXTREMITY VENOUS DOPPLER ULTRASOUND TECHNIQUE: Gray-scale sonography with compression, as well as color and duplex ultrasound, were performed to evaluate the deep venous system(s) from the level of the common femoral vein through the popliteal and proximal calf veins. COMPARISON:  02/17/2005 by report only FINDINGS: VENOUS Normal compressibility of the common femoral, superficial femoral veins. The distal femoral vein is noncompressible with hypoechoic partially occlusive thrombus, with only a small amount of color flow signal identified. Similarly the popliteal vein is mostly occluded by hypoechoic thrombus. Noncompressible soleal, anterior  tibial, and posterior tibial veins in the calf. Limited views of the contralateral common femoral vein are unremarkable. OTHER None. Limitations: none IMPRESSION: 1. POSITIVE for occlusive DVT in left calf extending through the popliteal vein into the distal femoral vein. These results will be called to the ordering clinician or representative by the Radiology Department at the imaging location. Electronically Signed   By: Lucrezia Europe M.D.   On: 11/24/2020 11:54   ECHOCARDIOGRAM COMPLETE  Result Date: 11/25/2020    ECHOCARDIOGRAM REPORT   Patient Name:   Brian Leach Date of Exam: 11/25/2020 Medical Rec #:  [id number]       Height:       67.0 in Accession #:    [id number]      Weight:       234.1 lb Date of Birth:  1950-11-06       BSA:          2.162 m Patient Age:    56 years        BP:           118/56 mmHg Patient Gender: M               HR:           69 bpm. Exam Location:  [facility] Procedure: 2D Echo, Cardiac Doppler, Color Doppler and Intracardiac            Opacification Agent Indications:    Pulmonary embolus  History:        Patient has no prior history of Echocardiogram examinations.                 DVT, Arrythmias:Atrial Fibrillation, Signs/Symptoms:Pulmonary                 embolus; Risk Factors:Obesity.  Sonographer:    Dustin Flock RDCS Referring Phys: [phone] Leanne Chang [facility]  Sonographer Comments: Patient is morbidly obese. Image acquisition challenging due to respiratory motion and Image acquisition challenging due to patient body habitus. IMPRESSIONS  1. Left ventricular ejection fraction, by estimation, is 60 to 65%. The left ventricle has normal function. The left ventricle has no regional wall motion abnormalities. There is mild left ventricular hypertrophy. Left ventricular diastolic parameters are indeterminate.  2. Right ventricular systolic function was not well visualized. The right ventricular size is not well visualized.  3. The mitral valve is normal in structure. No  evidence of mitral valve regurgitation. No evidence of mitral stenosis.  4. The aortic valve has an indeterminant number of cusps. Aortic valve regurgitation is not visualized. No aortic stenosis is present. FINDINGS  Left Ventricle: Left ventricular ejection fraction, by estimation, is 60 to 65%. The left ventricle has normal function. The left ventricle has no regional wall motion abnormalities. Definity contrast agent was given IV to delineate the left ventricular  endocardial borders. The left ventricular internal cavity size was normal in size. There is mild left ventricular hypertrophy. Left ventricular diastolic parameters are indeterminate. Right Ventricle: The right ventricular size is not well visualized. Right vetricular wall thickness was not well visualized. Right ventricular systolic function was not well visualized. Left Atrium: Left atrial size was not well visualized. Right Atrium: Right atrial size was not well visualized. Pericardium: There is no evidence of pericardial effusion. Mitral Valve: The mitral valve is normal in structure. No evidence of mitral valve regurgitation. No evidence of mitral valve stenosis. Tricuspid Valve: The tricuspid valve is normal in structure. Tricuspid valve regurgitation is not demonstrated. No evidence of tricuspid stenosis. Aortic Valve: The aortic valve has an indeterminant number of cusps. Aortic valve regurgitation is not visualized. No aortic stenosis is present. Aortic valve mean gradient measures 4.0 mmHg. Aortic valve peak gradient measures 8.7 mmHg. Aortic valve area, by VTI measures 3.96 cm. Pulmonic Valve: The pulmonic valve was not well visualized. Pulmonic valve regurgitation is not visualized. No evidence of pulmonic stenosis. Aorta: The aortic root is normal in size and structure. IAS/Shunts: The interatrial septum was not well visualized.  LEFT VENTRICLE PLAX 2D LVIDd:         5.62 cm  Diastology LVIDs:         3.48 cm  LV e' medial:    8.24 cm/s  LV PW:         1.32 cm  LV E/e' medial:  10.1 LV IVS:        1.30 cm  LV e' lateral:   9.64 cm/s LVOT diam:     2.50 cm  LV E/e' lateral: 8.7 LV SV:         89 LV SV Index:   41 LVOT Area:     4.91 cm  RIGHT VENTRICLE RV Basal diam:  3.94 cm RV S prime:     12.40 cm/s LEFT ATRIUM  Index       RIGHT ATRIUM           Index LA diam:        3.60 cm 1.66 cm/m  RA Area:     20.50 cm LA Vol (A2C):   76.0 ml 35.15 ml/m RA Volume:   62.60 ml  28.95 ml/m LA Vol (A4C):   86.6 ml 40.05 ml/m LA Biplane Vol: 89.3 ml 41.30 ml/m  AORTIC VALVE AV Area (Vmax):    3.30 cm AV Area (Vmean):   3.87 cm AV Area (VTI):     3.96 cm AV Vmax:           147.68 cm/s AV Vmean:          90.188 cm/s AV VTI:            0.225 m AV Peak Grad:      8.7 mmHg AV Mean Grad:      4.0 mmHg LVOT Vmax:         99.30 cm/s LVOT Vmean:        71.100 cm/s LVOT VTI:          0.182 m LVOT/AV VTI ratio: 0.81  AORTA Ao Root diam: 3.20 cm MITRAL VALVE MV Area (PHT): 3.76 cm    SHUNTS MV Decel Time: 202 msec    Systemic VTI:  0.18 m MV E velocity: 83.40 cm/s  Systemic Diam: 2.50 cm MV A velocity: 79.60 cm/s MV E/A ratio:  1.05 Carlyle Dolly MD Electronically signed by Carlyle Dolly MD Signature Date/Time: 11/25/2020/4:28:06 PM    Final     Microbiology: Recent Results (from the past 240 hour(s))  Resp Panel by RT-PCR (Flu A&B, Covid) Nasopharyngeal Swab     Status: None   Collection Time: 11/24/20  5:10 PM   Specimen: Nasopharyngeal Swab; Nasopharyngeal(NP) swabs in vial transport medium  Result Value Ref Range Status   SARS Coronavirus 2 by RT PCR NEGATIVE NEGATIVE Final    Comment: (NOTE) SARS-CoV-2 target nucleic acids are NOT DETECTED.  The SARS-CoV-2 RNA is generally detectable in upper respiratory specimens during the acute phase of infection. The lowest concentration of SARS-CoV-2 viral copies this assay can detect is 138 copies/mL. A negative result does not preclude SARS-Cov-2 infection and should not be used as the  sole basis for treatment or other patient management decisions. A negative result may occur with  improper specimen collection/handling, submission of specimen other than nasopharyngeal swab, presence of viral mutation(s) within the areas targeted by this assay, and inadequate number of viral copies(<138 copies/mL). A negative result must be combined with clinical observations, patient history, and epidemiological information. The expected result is Negative.  Fact Sheet for Patients:  EntrepreneurPulse.com.au  Fact Sheet for Healthcare Providers:  IncredibleEmployment.be  This test is no t yet approved or cleared by the [place] FDA and  has been authorized for detection and/or diagnosis of SARS-CoV-2 by FDA under an Emergency Use Authorization (EUA). This EUA will remain  in effect (meaning this test can be used) for the duration of the COVID-19 declaration under Section 564(b)(1) of the Act, 21 U.S.C.section 360bbb-3(b)(1), unless the authorization is terminated  or revoked sooner.       Influenza A by PCR NEGATIVE NEGATIVE Final   Influenza B by PCR NEGATIVE NEGATIVE Final    Comment: (NOTE) The Xpert Xpress SARS-CoV-2/FLU/RSV plus assay is intended as an aid in the diagnosis of influenza from Nasopharyngeal swab specimens and should not be used as a  sole basis for treatment. Nasal washings and aspirates are unacceptable for Xpert Xpress SARS-CoV-2/FLU/RSV testing.  Fact Sheet for Patients: EntrepreneurPulse.com.au  Fact Sheet for Healthcare Providers: IncredibleEmployment.be  This test is not yet approved or cleared by the [place] FDA and has been authorized for detection and/or diagnosis of SARS-CoV-2 by FDA under an Emergency Use Authorization (EUA). This EUA will remain in effect (meaning this test can be used) for the duration of the COVID-19 declaration under Section 564(b)(1) of the  Act, 21 U.S.C. section 360bbb-3(b)(1), unless the authorization is terminated or revoked.  Performed at [facility], [address] [place], [place] [address]   MRSA Next Gen by PCR, Nasal     Status: None   Collection Time: 11/24/20  9:29 PM   Specimen: Nasal Mucosa; Nasal Swab  Result Value Ref Range Status   MRSA by PCR Next Gen NOT DETECTED NOT DETECTED Final    Comment: (NOTE) The GeneXpert MRSA Assay (FDA approved for NASAL specimens only), is one component of a comprehensive MRSA colonization surveillance program. It is not intended to diagnose MRSA infection nor to guide or monitor treatment for MRSA infections. Test performance is not FDA approved in patients less than 20 years old. Performed at [facility], [address], [place], [place] [address]      Labs: Basic Metabolic Panel: Recent Labs  Lab 11/24/20 1419 11/24/20 1612 11/24/20 1612 11/25/20 0439 11/26/20 0540  NA  --  135  --  135 134*  K  --  3.9   < > 3.9 4.0  CL  --  104  --  103 104  CO2  --  22  --  22 22  GLUCOSE  --  113*  --  115* 126*  BUN  --  18  --  15 16  CREATININE 1.00 0.88  --  0.77 0.95  CALCIUM  --  8.5*  --  8.4* 8.3*  MG  --   --   --   --  2.2   < > = values in this interval not displayed.   Liver Function Tests: Recent Labs  Lab 11/24/20 1612  AST 20  ALT 25  ALKPHOS 68  BILITOT 0.5  PROT 7.0  ALBUMIN 3.8   No results for input(s): LIPASE, AMYLASE in the last 168 hours. No results for input(s): AMMONIA in the last 168 hours. CBC: Recent Labs  Lab 11/24/20 1612 11/25/20 0439 11/26/20 0540 11/27/20 0450  WBC 9.6 10.0 10.3 10.2  NEUTROABS 6.0  --   --   --   HGB 16.4 16.7 15.9 17.3*  HCT 49.5 50.4 48.6 52.4*  MCV 91.5 93.2 93.5 93.6  PLT 195 195 209 233   Cardiac Enzymes: No results for input(s): CKTOTAL, CKMB, CKMBINDEX, TROPONINI in the last 168 hours. BNP: Invalid input(s): POCBNP CBG: No results for input(s): GLUCAP in the last 168 hours.  Time  coordinating discharge:  36 minutes  Signed:  Orson Eva, DO [facility] Pager: [phone] 11/27/2020, 12:26 PM

## 2020-11-27 NOTE — Progress Notes (Signed)
ANTICOAGULATION CONSULT NOTE -   Pharmacy Consult for heparin gtt  Indication: pulmonary embolus  Allergies  Allergen Reactions   Codeine Rash    Patient Measurements: Height: 5\' 7"  (170.2 cm) Weight: 106.2 kg (234 lb 2.1 oz) IBW/kg (Calculated) : 66.1 Heparin Dosing Weight: HEPARIN DW (KG): 89.7   Vital Signs: Temp: 97.5 F (36.4 C) (06/18 0752) Temp Source: Oral (06/18 0752) BP: 114/73 (06/18 0400) Pulse Rate: 80 (06/18 0752)  Labs: Recent Labs    11/24/20 1612 11/25/20 0439 11/25/20 1526 11/26/20 0050 11/26/20 0540 11/27/20 0450  HGB 16.4 16.7  --   --  15.9 17.3*  HCT 49.5 50.4  --   --  48.6 52.4*  PLT 195 195  --   --  209 233  APTT  --  57*   < > 73* 70* 68*  HEPARINUNFRC  --  1.05*  --   --  0.51 0.36  CREATININE 0.88 0.77  --   --  0.95  --    < > = values in this interval not displayed.     Estimated Creatinine Clearance: 86.4 mL/min (by C-G formula based on SCr of 0.95 mg/dL).   Medical History: Past Medical History:  Diagnosis Date   Complication of anesthesia    difficult intubation   History of gout     Medications:  Medications Prior to Admission  Medication Sig Dispense Refill Last Dose   acetaminophen (TYLENOL) 500 MG tablet Take 500-1,000 mg by mouth every 8 (eight) hours as needed (pain).   PRN   ELIQUIS 5 MG TABS tablet Take 1 tablet by mouth 2 (two) times daily.   11/24/2020 at am   Scheduled:   Chlorhexidine Gluconate Cloth  6 each Topical Daily   diltiazem  180 mg Oral Daily   rosuvastatin  10 mg Oral q1800   Infusions:   heparin 1,800 Units/hr (11/27/20 0600)   PRN: acetaminophen **OR** acetaminophen, ALPRAZolam, guaiFENesin-dextromethorphan, HYDROcodone bit-homatropine, ondansetron **OR** ondansetron (ZOFRAN) IV, polyethylene glycol Anti-infectives (From admission, onward)    None       Assessment: Brian Leach a 70 y.o. male requires anticoagulation with a heparin iv infusion for the indication of  pulmonary  embolus. Heparin gtt will be started following pharmacy protocol per pharmacy consult. Patient had a single dose of apixaban 10mg  today at 1230 per MD that will require aPTT/HL correlation before transitioning to only HL monitoring.   6/16 HL 1.05- elevated due to apixaban dose , now HL 0.36 6/18 APTT 57>53> 73> 70>68- therapeutic.   HL correlating with aPTT, just HL monitoring from here  Goal of Therapy:  Heparin level 0.3-0.7 units/ml Monitor platelets by anticoagulation protocol: Yes   Plan:  Cont heparin at 1800 units/hr Check anti-Xa level  and APTT daily while on heparin Continue to monitor H&H and platelets.  F/U transition to po therapy  Brian Leach, PharmD, MBA, BCGP Clinical Pharmacist  11/27/2020 8:05 AM

## 2020-11-27 NOTE — Progress Notes (Signed)
Patient being discharged to home. IV access removed. Paperwork given and explained. Given [place] and allowed to dress. Taken down to car by wheelchair.

## 2020-12-01 LAB — LUPUS ANTICOAGULANT PANEL
DRVVT: 50 s — ABNORMAL HIGH (ref 0.0–47.0)
PTT Lupus Anticoagulant: 44.7 s (ref 0.0–51.9)

## 2020-12-01 LAB — DRVVT MIX: dRVVT Mix: 42.4 s — ABNORMAL HIGH (ref 0.0–40.4)

## 2020-12-01 LAB — DRVVT CONFIRM: dRVVT Confirm: 1.2 ratio (ref 0.8–1.2)

## 2020-12-02 LAB — PROTHROMBIN GENE MUTATION

## 2020-12-02 LAB — FACTOR 5 LEIDEN

## 2020-12-06 DIAGNOSIS — R059 Cough, unspecified: Secondary | ICD-10-CM | POA: Diagnosis not present

## 2020-12-06 DIAGNOSIS — I4891 Unspecified atrial fibrillation: Secondary | ICD-10-CM | POA: Diagnosis not present

## 2020-12-06 DIAGNOSIS — I82409 Acute embolism and thrombosis of unspecified deep veins of unspecified lower extremity: Secondary | ICD-10-CM | POA: Diagnosis not present

## 2020-12-06 DIAGNOSIS — Z6837 Body mass index (BMI) 37.0-37.9, adult: Secondary | ICD-10-CM | POA: Diagnosis not present

## 2020-12-06 DIAGNOSIS — I2699 Other pulmonary embolism without acute cor pulmonale: Secondary | ICD-10-CM | POA: Diagnosis not present

## 2020-12-22 NOTE — Progress Notes (Addendum)
Cardiology Office Note  Date: 12/23/2020   ID: Brian Leach, Brian Leach [birth year], MRN [id number]  PCP:  Curlene Labrum, MD  Cardiologist:  None Electrophysiologist:  None   Chief Complaint: Hospital follow up DVT / PE/atrial fibrillation  History of Present Illness: Brian Leach is a 70 y.o. male with a history of gout.   He recently presented  to [facility] with a 4 to 5-day history of left-sided calf pain and dyspnea on exertion.  Initially went to see his PCP on 615 due to the symptoms.  Venous duplex was ordered and was noted to be positive for DVT in left lower extremity.  Subsequently CTA of chest was ordered revealing bilateral pulmonary emboli with right ventricular strain.  He was advised to go to the emergency room.  He was started on IV heparin.  During his stay he was noted having SVT/atrial fibrillation.  He was started on Cardizem drip.  He was subsequently transitioned to p.o. diltiazem.  Cardiology was consulted..  Echocardiogram demonstrated EF of 60 to 65%.  No WMA's.  RV not well visualized.  He was transitioned to Eliquis 10 mg p.o. twice daily x7 days then 1 2 5  mg p.o. twice daily. Labs for factor V Leiden deficiency, lupus anticoagulant and prothrombin gene mutation were pending.  TSH was 2.8.   He is here for follow-up.  He states he is a little nervous and uptight.  He states people are telling him about bleeding issues and significant bleeding on anticoagulants.  He denies any shortness of breath/DOE.  In fact he states his breathing has improved since discharge from the hospital.  He denies any leg pain or lower extremity edema.  He denies any tachycardia but he is currently in atrial fibrillation with a controlled rate of 84 on EKG today.  He denies any CVA or TIA-like symptoms, bleeding issues.  Denies any claudication-like symptoms, or lower extremity edema.  He continues on a regimen of Eliquis 5 mg p.o. twice daily, Cardizem 240 mg daily, Crestor 10 mg  daily.   Past Medical History:  Diagnosis Date   Complication of anesthesia    difficult intubation   History of gout     Past Surgical History:  Procedure Laterality Date   COLONOSCOPY N/A 04/07/2013   Procedure: COLONOSCOPY;  Surgeon: Daneil Dolin, MD;  [facility];  Service: Endoscopy;  Laterality: N/A;  7:30 AM   COLONOSCOPY N/A 05/14/2018   Procedure: COLONOSCOPY;  Surgeon: Daneil Dolin, MD;  Location: [facility];  Service: Endoscopy;  Laterality: N/A;  10:30   KNEE ARTHROSCOPY Left    POLYPECTOMY  05/14/2018   Procedure: POLYPECTOMY;  Surgeon: Daneil Dolin, MD;  Location: [facility];  Service: Endoscopy;;  colon     Current Outpatient Medications  Medication Sig Dispense Refill   apixaban (ELIQUIS) 5 MG TABS tablet Take 2 tablets (10 mg total) by mouth 2 (two) times daily. (Patient taking differently: Take 5 mg by mouth 2 (two) times daily.) 74 tablet 0   diltiazem (CARDIZEM CD) 240 MG 24 hr capsule Take 1 capsule (240 mg total) by mouth daily. 30 capsule 1   loratadine (CLARITIN) 10 MG tablet Take 10 mg by mouth daily.     pantoprazole (PROTONIX) 40 MG tablet Take 1 tablet (40 mg total) by mouth daily. 30 tablet 1   rosuvastatin (CRESTOR) 10 MG tablet Take 1 tablet (10 mg total) by mouth daily at 6 PM. 30 tablet  1   No current facility-administered medications for this visit.   Allergies:  Codeine   Social History: The patient  reports that he has never smoked. He has never used smokeless tobacco. He reports that he does not drink alcohol and does not use drugs.   Family History: The patient's family history includes Heart disease in his brother.   ROS:  Please see the history of present illness. Otherwise, complete review of systems is positive for none.  All other systems are reviewed and negative.   Physical Exam: VS:  BP 130/70   Pulse 90   Ht 5' 7.5" (1.715 m)   Wt 248 lb 6.4 oz (112.7 kg)   SpO2 99%   BMI 38.33 kg/m , BMI Body mass  index is 38.33 kg/m.  Wt Readings from Last 3 Encounters:  12/23/20 248 lb 6.4 oz (112.7 kg)  11/24/20 234 lb 2.1 oz (106.2 kg)  05/14/18 223 lb (101.2 kg)    General: Obese patient appears comfortable at rest. Neck: Supple, no elevated JVP or carotid bruits, no thyromegaly. Lungs: Clear to auscultation, nonlabored breathing at rest. Cardiac: Regular rate and rhythm, no S3 or significant systolic murmur, no pericardial rub. Extremities: No pitting edema, distal pulses 2+. Skin: Warm and dry. Musculoskeletal: No kyphosis. Neuropsychiatric: Alert and oriented x3, affect grossly appropriate.  ECG: EKG December 23, 2020 atrial fibrillation with a rate of 84.  Recent Labwork: 11/24/2020: ALT 25; AST 20; B Natriuretic Peptide 61.0 11/26/2020: BUN 16; Creatinine, Ser 0.95; Magnesium 2.2; Potassium 4.0; Sodium 134; TSH 2.891 11/27/2020: Hemoglobin 17.3; Platelets 233     Component Value Date/Time   CHOL 186 11/26/2020 0540   TRIG 100 11/26/2020 0540   HDL 55 11/26/2020 0540   CHOLHDL 3.4 11/26/2020 0540   VLDL 20 11/26/2020 0540   LDLCALC 111 (H) 11/26/2020 0540    Other Studies Reviewed Today:  Procedures/Studies: CT Angio Chest Pulmonary Embolism (PE) W or WO Contrast   Result Date: 11/24/2020 CLINICAL DATA:  Rule out pulmonary embolus.  DVT. EXAM: CT ANGIOGRAPHY CHEST WITH CONTRAST TECHNIQUE: Multidetector CT imaging of the chest was performed using the standard protocol during bolus administration of intravenous contrast. Multiplanar CT image reconstructions and MIPs were obtained to evaluate the vascular anatomy. CONTRAST:  159mL OMNIPAQUE IOHEXOL 350 MG/ML SOLN COMPARISON:  None. FINDINGS: Cardiovascular: There is a clot within the distal right main pulmonary artery which extends into the right upper lobe are and segmental pulmonary arteries compatible with acute pulmonary embolus. Segmental filling defects within the posterior left lower lobe also noted compatible with acute pulmonary  embolus, image 202/5. Filling defects within segmental branches of the right middle lobe pulmonary artery and subsegmental branches of the right lower lobe pulmonary artery are also noted. The heart size appears normal.  The RV to LV ratio is equal to 1.03. Mild aortic atherosclerosis and mild coronary artery calcifications. Mediastinum/Nodes: No enlarged mediastinal, hilar, or axillary lymph nodes. Thyroid gland, trachea, and esophagus demonstrate no significant findings. Small hiatal hernia. Lungs/Pleura: No pleural effusion. No airspace consolidation, atelectasis, or pneumothorax. Upper Abdomen: No acute abnormality within the imaged portions of the upper abdomen. Musculoskeletal: Degenerative disc disease identified. No acute or suspicious findings. Review of the MIP images confirms the above findings. IMPRESSION: 1. Examination is positive for acute bilateral pulmonary emboli. Positive for acute PE with of right heart strain (RV/LV Ratio 1.03) consistent with at least submassive (intermediate risk) PE. The presence of right heart strain has been associated with an increased  risk of morbidity and mortality. 2. Aortic atherosclerosis. Coronary artery calcifications. Aortic Atherosclerosis (ICD10-I70.0). Critical Value/emergent results were called by telephone at the time of interpretation on 11/24/2020 at 2:57 pm to provider AMY BOYD , who verbally acknowledged these results. Electronically Signed   By: Kerby Moors M.D.   On: 11/24/2020 14:58   US Venous Img Lower Unilateral Left (DVT)   Result Date: 11/24/2020 CLINICAL DATA:  Pain, edema EXAM: LEFT LOWER EXTREMITY VENOUS DOPPLER ULTRASOUND TECHNIQUE: Gray-scale sonography with compression, as well as color and duplex ultrasound, were performed to evaluate the deep venous system(s) from the level of the common femoral vein through the popliteal and proximal calf veins. COMPARISON:  02/17/2005 by report only FINDINGS: VENOUS Normal compressibility of the  common femoral, superficial femoral veins. The distal femoral vein is noncompressible with hypoechoic partially occlusive thrombus, with only a small amount of color flow signal identified. Similarly the popliteal vein is mostly occluded by hypoechoic thrombus. Noncompressible soleal, anterior tibial, and posterior tibial veins in the calf. Limited views of the contralateral common femoral vein are unremarkable. OTHER None. Limitations: none IMPRESSION: 1. POSITIVE for occlusive DVT in left calf extending through the popliteal vein into the distal femoral vein. These results will be called to the ordering clinician or representative by the Radiology Department at the imaging location. Electronically Signed   By: Lucrezia Europe M.D.   On: 11/24/2020 11:54   ECHOCARDIOGRAM COMPLETE   Result Date: 11/25/2020    ECHOCARDIOGRAM REPORT   Patient Name:   ALANDIS BLUEMEL Date of Exam: 11/25/2020 Medical Rec #:  [id number]       Height:       67.0 in Accession #:    [id number]      Weight:       234.1 lb Date of Birth:  1951/05/28       BSA:          2.162 m Patient Age:    70 years        BP:           118/56 mmHg Patient Gender: M               HR:           69 bpm. Exam Location:  [facility] Procedure: 2D Echo, Cardiac Doppler, Color Doppler and Intracardiac            Opacification Agent Indications:    Pulmonary embolus  History:        Patient has no prior history of Echocardiogram examinations.                 DVT, Arrythmias:Atrial Fibrillation, Signs/Symptoms:Pulmonary                 embolus; Risk Factors:Obesity.  Sonographer:    Dustin Flock RDCS Referring Phys: [id number] Leanne Chang [facility]  Sonographer Comments: Patient is morbidly obese. Image acquisition challenging due to respiratory motion and Image acquisition challenging due to patient body habitus. IMPRESSIONS  1. Left ventricular ejection fraction, by estimation, is 60 to 65%. The left ventricle has normal function. The left ventricle has no regional  wall motion abnormalities. There is mild left ventricular hypertrophy. Left ventricular diastolic parameters are indeterminate.  2. Right ventricular systolic function was not well visualized. The right ventricular size is not well visualized.  3. The mitral valve is normal in structure. No evidence of mitral valve regurgitation. No evidence of mitral stenosis.  4. The aortic valve has  an indeterminant number of cusps. Aortic valve regurgitation is not visualized. No aortic stenosis is present. FINDINGS  Left Ventricle: Left ventricular ejection fraction, by estimation, is 60 to 65%. The left ventricle has normal function. The left ventricle has no regional wall motion abnormalities. Definity contrast agent was given IV to delineate the left ventricular  endocardial borders. The left ventricular internal cavity size was normal in size. There is mild left ventricular hypertrophy. Left ventricular diastolic parameters are indeterminate. Right Ventricle: The right ventricular size is not well visualized. Right vetricular wall thickness was not well visualized. Right ventricular systolic function was not well visualized. Left Atrium: Left atrial size was not well visualized. Right Atrium: Right atrial size was not well visualized. Pericardium: There is no evidence of pericardial effusion. Mitral Valve: The mitral valve is normal in structure. No evidence of mitral valve regurgitation. No evidence of mitral valve stenosis. Tricuspid Valve: The tricuspid valve is normal in structure. Tricuspid valve regurgitation is not demonstrated. No evidence of tricuspid stenosis. Aortic Valve: The aortic valve has an indeterminant number of cusps. Aortic valve regurgitation is not visualized. No aortic stenosis is present. Aortic valve mean gradient measures 4.0 mmHg. Aortic valve peak gradient measures 8.7 mmHg. Aortic valve area, by VTI measures 3.96 cm. Pulmonic Valve: The pulmonic valve was not well visualized. Pulmonic valve  regurgitation is not visualized. No evidence of pulmonic stenosis. Aorta: The aortic root is normal in size and structure. IAS/Shunts: The interatrial septum was not well visualized.  LEFT VENTRICLE PLAX 2D LVIDd:         5.62 cm  Diastology LVIDs:         3.48 cm  LV e' medial:    8.24 cm/s LV PW:         1.32 cm  LV E/e' medial:  10.1 LV IVS:        1.30 cm  LV e' lateral:   9.64 cm/s LVOT diam:     2.50 cm  LV E/e' lateral: 8.7 LV SV:         89 LV SV Index:   41 LVOT Area:     4.91 cm  RIGHT VENTRICLE RV Basal diam:  3.94 cm RV S prime:     12.40 cm/s LEFT ATRIUM             Index       RIGHT ATRIUM           Index LA diam:        3.60 cm 1.66 cm/m  RA Area:     20.50 cm LA Vol (A2C):   76.0 ml 35.15 ml/m RA Volume:   62.60 ml  28.95 ml/m LA Vol (A4C):   86.6 ml 40.05 ml/m LA Biplane Vol: 89.3 ml 41.30 ml/m  AORTIC VALVE AV Area (Vmax):    3.30 cm AV Area (Vmean):   3.87 cm AV Area (VTI):     3.96 cm AV Vmax:           147.68 cm/s AV Vmean:          90.188 cm/s AV VTI:            0.225 m AV Peak Grad:      8.7 mmHg AV Mean Grad:      4.0 mmHg LVOT Vmax:         99.30 cm/s LVOT Vmean:        71.100 cm/s LVOT VTI:  0.182 m LVOT/AV VTI ratio: 0.81  AORTA Ao Root diam: 3.20 cm MITRAL VALVE MV Area (PHT): 3.76 cm    SHUNTS MV Decel Time: 202 msec    Systemic VTI:  0.18 m MV E velocity: 83.40 cm/s  Systemic Diam: 2.50 cm MV A velocity: 79.60 cm/s MV E/A ratio:  1.05 Carlyle Dolly MD Electronically signed by Carlyle Dolly MD Signature Date/Time: 11/25/2020/4:28:06 PM    Final        Assessment and Plan:  1. Acute deep vein thrombosis (DVT) of proximal vein of left lower extremity ([place])   2. Acute pulmonary embolism, unspecified pulmonary embolism type, unspecified whether acute cor pulmonale present ([place])   3. Atrial fibrillation, unspecified type ([place])   4. Coronary artery calcification seen on CT scan   5. Mixed hyperlipidemia    1. Acute deep vein thrombosis (DVT) of proximal vein of  left lower extremity (HCC) LLE DVT (from L Popliteal vein to distal femoral vein), currently denies any calf pain or left lower extremity lower extremity tightness.  Continue Eliquis 5 mg p.o. twice daily.  Dr. Harl Bowie or APP 1 month.  Patient was negative for lupus anticoagulant disorder, factor V Leiden deficiency, and prothrombin gene mutation per labs.  I discussed all labs with patient and his wife today.  2. Acute pulmonary embolism, unspecified pulmonary embolism type, unspecified whether acute cor pulmonale present (HCC) bilateral PEs without RV strain patient states his Eliquis had recently been decreased down to 5 mg from 10 mg which he took for 7 days.  Continue Eliquis 5 mg p.o. twice daily.  Patient states his breathing is much better.  Denies any shortness of breath currently.  States he has been working around his home without any significant shortness of breath.  3. Atrial fibrillation, unspecified type ([place]) Had transient atrial fibrillation which eventually converted back to NSR per discharge note.. CHA2DS2-VASc 3.  EKG today showed he is back in atrial fibrillation with a rate of 84.  He is compliant with his diltiazem and Eliquis.  No bleeding issues.  Continue Cardizem 240 mg daily.  Continue Eliquis 5 mg p.o. twice daily.  I will talk to Dr. Harl Bowie regarding possible cardioversion and when it could possibly be done based on other current issues i.e. DVT and PE for instructions on possible cardioversion.  He will follow-up in 1 month.  4. Coronary artery calcification by CT CTA chest with aortic atherosclerosis and coronary artery calcifications. Troponins during hospitalizations were negative.  He currently denies any anginal symptoms or shortness of breath/DOE.  Medication Adjustments/Labs and Tests Ordered: Current medicines are reviewed at length with the patient today.  Concerns regarding medicines are outlined above.   Disposition: Follow-up with Dr. Harl Bowie or APP 1  month  Signed, Levell July, NP 12/23/2020 8:39 AM    [facility] [place], [place], [place] [address] Phone: [phone]; Fax: [phone]

## 2020-12-23 ENCOUNTER — Ambulatory Visit: Payer: PPO | Admitting: Family Medicine

## 2020-12-23 ENCOUNTER — Encounter: Payer: Self-pay | Admitting: Family Medicine

## 2020-12-23 VITALS — BP 130/70 | HR 90 | Ht 67.5 in | Wt 248.4 lb

## 2020-12-23 DIAGNOSIS — I2510 Atherosclerotic heart disease of native coronary artery without angina pectoris: Secondary | ICD-10-CM

## 2020-12-23 DIAGNOSIS — I824Y2 Acute embolism and thrombosis of unspecified deep veins of left proximal lower extremity: Secondary | ICD-10-CM | POA: Diagnosis not present

## 2020-12-23 DIAGNOSIS — I4891 Unspecified atrial fibrillation: Secondary | ICD-10-CM

## 2020-12-23 DIAGNOSIS — I2699 Other pulmonary embolism without acute cor pulmonale: Secondary | ICD-10-CM

## 2020-12-23 DIAGNOSIS — E782 Mixed hyperlipidemia: Secondary | ICD-10-CM

## 2020-12-23 NOTE — Patient Instructions (Addendum)
Medication Instructions:  Your physician recommends that you continue on your current medications as directed. Please refer to the Current Medication list given to you today.   Labwork: none  Testing/Procedures: none  Follow-Up:  Your physician recommends that you schedule a follow-up appointment in: 1 month  Any Other Special Instructions Will Be Listed Below (If Applicable).  If you need a refill on your cardiac medications before your next appointment, please call your pharmacy.  

## 2020-12-27 DIAGNOSIS — I82409 Acute embolism and thrombosis of unspecified deep veins of unspecified lower extremity: Secondary | ICD-10-CM | POA: Diagnosis not present

## 2020-12-27 DIAGNOSIS — I2699 Other pulmonary embolism without acute cor pulmonale: Secondary | ICD-10-CM | POA: Diagnosis not present

## 2020-12-27 DIAGNOSIS — R059 Cough, unspecified: Secondary | ICD-10-CM | POA: Diagnosis not present

## 2020-12-27 DIAGNOSIS — I4891 Unspecified atrial fibrillation: Secondary | ICD-10-CM | POA: Diagnosis not present

## 2020-12-31 DIAGNOSIS — R739 Hyperglycemia, unspecified: Secondary | ICD-10-CM | POA: Diagnosis not present

## 2020-12-31 DIAGNOSIS — E7801 Familial hypercholesterolemia: Secondary | ICD-10-CM | POA: Diagnosis not present

## 2020-12-31 DIAGNOSIS — E7800 Pure hypercholesterolemia, unspecified: Secondary | ICD-10-CM | POA: Diagnosis not present

## 2020-12-31 DIAGNOSIS — R5383 Other fatigue: Secondary | ICD-10-CM | POA: Diagnosis not present

## 2021-01-04 DIAGNOSIS — I2699 Other pulmonary embolism without acute cor pulmonale: Secondary | ICD-10-CM | POA: Diagnosis not present

## 2021-01-04 DIAGNOSIS — I2584 Coronary atherosclerosis due to calcified coronary lesion: Secondary | ICD-10-CM | POA: Diagnosis not present

## 2021-01-04 DIAGNOSIS — Z6839 Body mass index (BMI) 39.0-39.9, adult: Secondary | ICD-10-CM | POA: Diagnosis not present

## 2021-01-04 DIAGNOSIS — I2510 Atherosclerotic heart disease of native coronary artery without angina pectoris: Secondary | ICD-10-CM | POA: Diagnosis not present

## 2021-01-04 DIAGNOSIS — I82409 Acute embolism and thrombosis of unspecified deep veins of unspecified lower extremity: Secondary | ICD-10-CM | POA: Diagnosis not present

## 2021-01-04 DIAGNOSIS — F411 Generalized anxiety disorder: Secondary | ICD-10-CM | POA: Diagnosis not present

## 2021-01-04 DIAGNOSIS — I4891 Unspecified atrial fibrillation: Secondary | ICD-10-CM | POA: Diagnosis not present

## 2021-01-04 DIAGNOSIS — D72829 Elevated white blood cell count, unspecified: Secondary | ICD-10-CM | POA: Diagnosis not present

## 2021-01-04 NOTE — Progress Notes (Signed)
Would give him additional time to recover from his PE, particularly if he is rate controlled and not significantly symptomatic. Would consider DCCV at 3 months from his PE. If issues with rate control or significant symptoms could consider earlier  Zandra Abts MD

## 2021-01-20 NOTE — Progress Notes (Signed)
Cardiology Office Note  Date: 01/21/2021   ID: Brian Leach, Brian Leach Jun 15, 1950, MRN [id number]  PCP:  Curlene Labrum, MD  Cardiologist:  Carlyle Dolly, MD Electrophysiologist:  None   Chief Complaint: Hospital follow up DVT / PE/atrial fibrillation  History of Present Illness: Brian Leach is a 70 y.o. male with a history of gout.   He recently presented  to [facility] 11/27/2020 with a 4 to 5-day history of left-sided calf pain and dyspnea on exertion.  Initially went to see his PCP on 11/24/2020 due to the symptoms.  Venous duplex was ordered and was noted to be positive for DVT in left lower extremity.  Subsequently CTA of chest was ordered revealing bilateral pulmonary emboli with right ventricular strain.  He was advised to go to the emergency room.  He was started on IV heparin.  During his stay he was noted having SVT/atrial fibrillation.  He was started on Cardizem drip.  He was subsequently transitioned to p.o. diltiazem.  Cardiology was consulted..  Echocardiogram demonstrated EF of 60 to 65%.  No WMA's.  RV not well visualized.  He was transitioned to Eliquis 10 mg p.o. twice daily x7 days then '1 2 5 '$ mg p.o. twice daily. Labs for factor V Leiden deficiency, lupus anticoagulant and prothrombin gene mutation were pending.  TSH was 2.8.   Was last here for follow-up after new onset atrial fibrillation, DVT and PE.  He was a little nervous and uptight.  He stated people were telling him about bleeding issues and significant bleeding on anticoagulants.  Denies shortness of breath/DOE.  His breathing had improved since discharge from the hospital.  He denied any leg pain or lower extremity edema.  He denied any tachycardia.  He remained in atrial fibrillation with a controlled rate of 84 on EKG today.  He denied any CVA or TIA-like symptoms, bleeding issues.  He denied any claudication-like symptoms, or lower extremity edema.  He continues on a regimen of Eliquis 5 mg p.o. twice daily,  Cardizem 240 mg daily, Crestor 10 mg daily.   He is here for follow-up on atrial fibrillation, DVT and PE.  States he can occasionally feel palpitations.  States has been doing work outside without any significant issues.  Denies any significant DOE or SOB.  Denies any issues with tachycardia, denies any poor chest pain.  States the swelling in his left leg is better.  States he has been compliant with all of his medications including Eliquis without bleeding.  He continues on Cardizem 240 mg daily along with Eliquis 5 mg p.o. twice daily.  EKG today shows continuation with atrial fibrillation with a rate of 89.  He denies any anginal symptoms.  Denies any orthostatic symptoms or CVA/TIA-like symptoms.  Denies any PND or orthopnea.  No claudication-like symptoms.  Has some minor mild edema in both lower extremities.  Dr. Harl Bowie had mentioned patient would need at least 3 months of therapy for recent DVT and PE before consideration of DC cardioversion for atrial fibrillation.  Atrial rate is controlled today.       Past Medical History:  Diagnosis Date   Complication of anesthesia    difficult intubation   History of gout     Past Surgical History:  Procedure Laterality Date   COLONOSCOPY N/A 04/07/2013   Procedure: COLONOSCOPY;  Surgeon: Daneil Dolin, MD;  [facility];  Service: Endoscopy;  Laterality: N/A;  7:30 AM   COLONOSCOPY N/A 05/14/2018  Procedure: COLONOSCOPY;  Surgeon: Daneil Dolin, MD;  Location: [facility];  Service: Endoscopy;  Laterality: N/A;  10:30   KNEE ARTHROSCOPY Left    POLYPECTOMY  05/14/2018   Procedure: POLYPECTOMY;  Surgeon: Daneil Dolin, MD;  Location: [facility];  Service: Endoscopy;;  colon     Current Outpatient Medications  Medication Sig Dispense Refill   apixaban (ELIQUIS) 5 MG TABS tablet Take 5 mg by mouth 2 (two) times daily.     diltiazem (CARDIZEM CD) 240 MG 24 hr capsule Take 1 capsule (240 mg total) by mouth daily. 30  capsule 1   loratadine (CLARITIN) 10 MG tablet Take 10 mg by mouth daily.     pantoprazole (PROTONIX) 40 MG tablet Take 1 tablet (40 mg total) by mouth daily. 30 tablet 1   rosuvastatin (CRESTOR) 10 MG tablet Take 1 tablet (10 mg total) by mouth daily at 6 PM. 30 tablet 1   No current facility-administered medications for this visit.   Allergies:  Codeine   Social History: The patient  reports that he has never smoked. He has never used smokeless tobacco. He reports that he does not drink alcohol and does not use drugs.   Family History: The patient's family history includes Heart disease in his brother.   ROS:  Please see the history of present illness. Otherwise, complete review of systems is positive for none.  All other systems are reviewed and negative.   Physical Exam: VS:  BP 110/70   Pulse 88   Ht 5' 7.5" (1.715 m)   Wt 248 lb 12.8 oz (112.9 kg)   SpO2 97%   BMI 38.39 kg/m , BMI Body mass index is 38.39 kg/m.  Wt Readings from Last 3 Encounters:  01/21/21 248 lb 12.8 oz (112.9 kg)  12/23/20 248 lb 6.4 oz (112.7 kg)  11/24/20 234 lb 2.1 oz (106.2 kg)    General: Obese patient appears comfortable at rest. Neck: Supple, no elevated JVP or carotid bruits, no thyromegaly. Lungs: Clear to auscultation, nonlabored breathing at rest. Cardiac: Irregularly irregular rate and rhythm, no S3 or significant systolic murmur, no pericardial rub. Extremities: Mild pitting edema, distal pulses 2+. Skin: Warm and dry. Musculoskeletal: No kyphosis. Neuropsychiatric: Alert and oriented x3, affect grossly appropriate.  ECG: 01/21/2021 EKG atrial fibrillation with a rate of 89.  Recent Labwork: 11/24/2020: ALT 25; AST 20; B Natriuretic Peptide 61.0 11/26/2020: BUN 16; Creatinine, Ser 0.95; Magnesium 2.2; Potassium 4.0; Sodium 134; TSH 2.891 11/27/2020: Hemoglobin 17.3; Platelets 233     Component Value Date/Time   CHOL 186 11/26/2020 0540   TRIG 100 11/26/2020 0540   HDL 55 11/26/2020  0540   CHOLHDL 3.4 11/26/2020 0540   VLDL 20 11/26/2020 0540   LDLCALC 111 (H) 11/26/2020 0540    Other Studies Reviewed Today:  Procedures/Studies: CT Angio Chest Pulmonary Embolism (PE) W or WO Contrast   Result Date: 11/24/2020 CLINICAL DATA:  Rule out pulmonary embolus.  DVT. EXAM: CT ANGIOGRAPHY CHEST WITH CONTRAST TECHNIQUE: Multidetector CT imaging of the chest was performed using the standard protocol during bolus administration of intravenous contrast. Multiplanar CT image reconstructions and MIPs were obtained to evaluate the vascular anatomy. CONTRAST:  193m OMNIPAQUE IOHEXOL 350 MG/ML SOLN COMPARISON:  None. FINDINGS: Cardiovascular: There is a clot within the distal right main pulmonary artery which extends into the right upper lobe are and segmental pulmonary arteries compatible with acute pulmonary embolus. Segmental filling defects within the posterior left lower lobe also  noted compatible with acute pulmonary embolus, image 202/5. Filling defects within segmental branches of the right middle lobe pulmonary artery and subsegmental branches of the right lower lobe pulmonary artery are also noted. The heart size appears normal.  The RV to LV ratio is equal to 1.03. Mild aortic atherosclerosis and mild coronary artery calcifications. Mediastinum/Nodes: No enlarged mediastinal, hilar, or axillary lymph nodes. Thyroid gland, trachea, and esophagus demonstrate no significant findings. Small hiatal hernia. Lungs/Pleura: No pleural effusion. No airspace consolidation, atelectasis, or pneumothorax. Upper Abdomen: No acute abnormality within the imaged portions of the upper abdomen. Musculoskeletal: Degenerative disc disease identified. No acute or suspicious findings. Review of the MIP images confirms the above findings. IMPRESSION: 1. Examination is positive for acute bilateral pulmonary emboli. Positive for acute PE with of right heart strain (RV/LV Ratio 1.03) consistent with at least  submassive (intermediate risk) PE. The presence of right heart strain has been associated with an increased risk of morbidity and mortality. 2. Aortic atherosclerosis. Coronary artery calcifications. Aortic Atherosclerosis (ICD10-I70.0). Critical Value/emergent results were called by telephone at the time of interpretation on 11/24/2020 at 2:57 pm to provider AMY BOYD , who verbally acknowledged these results. Electronically Signed   By: Kerby Moors M.D.   On: 11/24/2020 14:58   US Venous Img Lower Unilateral Left (DVT)   Result Date: 11/24/2020 CLINICAL DATA:  Pain, edema EXAM: LEFT LOWER EXTREMITY VENOUS DOPPLER ULTRASOUND TECHNIQUE: Gray-scale sonography with compression, as well as color and duplex ultrasound, were performed to evaluate the deep venous system(s) from the level of the common femoral vein through the popliteal and proximal calf veins. COMPARISON:  02/17/2005 by report only FINDINGS: VENOUS Normal compressibility of the common femoral, superficial femoral veins. The distal femoral vein is noncompressible with hypoechoic partially occlusive thrombus, with only a small amount of color flow signal identified. Similarly the popliteal vein is mostly occluded by hypoechoic thrombus. Noncompressible soleal, anterior tibial, and posterior tibial veins in the calf. Limited views of the contralateral common femoral vein are unremarkable. OTHER None. Limitations: none IMPRESSION: 1. POSITIVE for occlusive DVT in left calf extending through the popliteal vein into the distal femoral vein. These results will be called to the ordering clinician or representative by the Radiology Department at the imaging location. Electronically Signed   By: Lucrezia Europe M.D.   On: 11/24/2020 11:54   ECHOCARDIOGRAM COMPLETE   Result Date: 11/25/2020    ECHOCARDIOGRAM REPORT   Patient Name:   Brian Leach Date of Exam: 11/25/2020 Medical Rec #:  [id number]       Height:       67.0 in Accession #:    [id number]       Weight:       234.1 lb Date of Birth:  [birth year]       BSA:          2.162 m Patient Age:    23 years        BP:           118/56 mmHg Patient Gender: M               HR:           69 bpm. Exam Location:  [facility] Procedure: 2D Echo, Cardiac Doppler, Color Doppler and Intracardiac            Opacification Agent Indications:    Pulmonary embolus  History:        Patient has no prior history of Echocardiogram  examinations.                 DVT, Arrythmias:Atrial Fibrillation, Signs/Symptoms:Pulmonary                 embolus; Risk Factors:Obesity.  Sonographer:    Dustin Flock RDCS Referring Phys: [id number] Leanne Chang [facility]  Sonographer Comments: Patient is morbidly obese. Image acquisition challenging due to respiratory motion and Image acquisition challenging due to patient body habitus. IMPRESSIONS  1. Left ventricular ejection fraction, by estimation, is 60 to 65%. The left ventricle has normal function. The left ventricle has no regional wall motion abnormalities. There is mild left ventricular hypertrophy. Left ventricular diastolic parameters are indeterminate.  2. Right ventricular systolic function was not well visualized. The right ventricular size is not well visualized.  3. The mitral valve is normal in structure. No evidence of mitral valve regurgitation. No evidence of mitral stenosis.  4. The aortic valve has an indeterminant number of cusps. Aortic valve regurgitation is not visualized. No aortic stenosis is present. FINDINGS  Left Ventricle: Left ventricular ejection fraction, by estimation, is 60 to 65%. The left ventricle has normal function. The left ventricle has no regional wall motion abnormalities. Definity contrast agent was given IV to delineate the left ventricular  endocardial borders. The left ventricular internal cavity size was normal in size. There is mild left ventricular hypertrophy. Left ventricular diastolic parameters are indeterminate. Right Ventricle: The right ventricular  size is not well visualized. Right vetricular wall thickness was not well visualized. Right ventricular systolic function was not well visualized. Left Atrium: Left atrial size was not well visualized. Right Atrium: Right atrial size was not well visualized. Pericardium: There is no evidence of pericardial effusion. Mitral Valve: The mitral valve is normal in structure. No evidence of mitral valve regurgitation. No evidence of mitral valve stenosis. Tricuspid Valve: The tricuspid valve is normal in structure. Tricuspid valve regurgitation is not demonstrated. No evidence of tricuspid stenosis. Aortic Valve: The aortic valve has an indeterminant number of cusps. Aortic valve regurgitation is not visualized. No aortic stenosis is present. Aortic valve mean gradient measures 4.0 mmHg. Aortic valve peak gradient measures 8.7 mmHg. Aortic valve area, by VTI measures 3.96 cm. Pulmonic Valve: The pulmonic valve was not well visualized. Pulmonic valve regurgitation is not visualized. No evidence of pulmonic stenosis. Aorta: The aortic root is normal in size and structure. IAS/Shunts: The interatrial septum was not well visualized.  LEFT VENTRICLE PLAX 2D LVIDd:         5.62 cm  Diastology LVIDs:         3.48 cm  LV e' medial:    8.24 cm/s LV PW:         1.32 cm  LV E/e' medial:  10.1 LV IVS:        1.30 cm  LV e' lateral:   9.64 cm/s LVOT diam:     2.50 cm  LV E/e' lateral: 8.7 LV SV:         89 LV SV Index:   41 LVOT Area:     4.91 cm  RIGHT VENTRICLE RV Basal diam:  3.94 cm RV S prime:     12.40 cm/s LEFT ATRIUM             Index       RIGHT ATRIUM           Index LA diam:        3.60 cm 1.66 cm/m  RA Area:  20.50 cm LA Vol (A2C):   76.0 ml 35.15 ml/m RA Volume:   62.60 ml  28.95 ml/m LA Vol (A4C):   86.6 ml 40.05 ml/m LA Biplane Vol: 89.3 ml 41.30 ml/m  AORTIC VALVE AV Area (Vmax):    3.30 cm AV Area (Vmean):   3.87 cm AV Area (VTI):     3.96 cm AV Vmax:           147.68 cm/s AV Vmean:          90.188 cm/s  AV VTI:            0.225 m AV Peak Grad:      8.7 mmHg AV Mean Grad:      4.0 mmHg LVOT Vmax:         99.30 cm/s LVOT Vmean:        71.100 cm/s LVOT VTI:          0.182 m LVOT/AV VTI ratio: 0.81  AORTA Ao Root diam: 3.20 cm MITRAL VALVE MV Area (PHT): 3.76 cm    SHUNTS MV Decel Time: 202 msec    Systemic VTI:  0.18 m MV E velocity: 83.40 cm/s  Systemic Diam: 2.50 cm MV A velocity: 79.60 cm/s MV E/A ratio:  1.05 Carlyle Dolly MD Electronically signed by Carlyle Dolly MD Signature Date/Time: 11/25/2020/4:28:06 PM    Final        Assessment and Plan:  1. Acute deep vein thrombosis (DVT) of proximal vein of left lower extremity ([place])   2. Acute pulmonary embolism, unspecified pulmonary embolism type, unspecified whether acute cor pulmonale present ([place])   3. Atrial fibrillation, unspecified type ([place])   4. Coronary artery calcification seen on CT scan     1. Acute deep vein thrombosis (DVT) of proximal vein of left lower extremity (H[facility]) LLE DVT (from L Popliteal vein to distal femoral vein).   He was negative for lupus anticoagulant disorder, factor V Leiden deficiency, and prothrombin gene mutation per labs.   On follow-up today he denies any left leg tightness or pain.  States he has been taking his Eliquis without interruption without any bleeding issues.  Plan is to get lower extremity venous Doppler near the end of September to assess for resolution of DVT.  He states he has been working out in his yard without issue.  2. Acute pulmonary embolism, unspecified pulmonary embolism type, unspecified whether acute cor pulmonale present (HCC) bilateral PEs without RV strain patient states his Eliquis had recently been decreased down to 5 mg from 10 mg which he took for 7 days.    Patient states his breathing is much better.  Denies any shortness of breath currently.  States he has been working around his home without any significant shortness of breath.  Plan is to repeat CT of chest to check for  resolution of pulmonary embolus near the end of September  3. Atrial fibrillation, unspecified type ([place]) Had transient atrial fibrillation which eventually converted back to NSR per discharge note.. CHA2DS2-VASc 3.  EKG today shows continuation of atrial fibrillation with a rate of 89.  He is continuing his Eliquis at 5 mg p.o. twice daily without interruption.  He is continuing his Cardizem CD 240 mg p.o. daily.  4. Coronary artery calcification by CT CTA chest with aortic atherosclerosis and coronary artery calcifications. Troponins during hospitalizations were negative.  He currently denies any anginal symptoms or shortness of breath/DOE.  5.  Hyperlipidemia Continue Crestor 10 mg daily.  Lipid panel  on 11/26/2020: TC 186, TG 100, HDL 55, LDL 111.  Medication Adjustments/Labs and Tests Ordered: Current medicines are reviewed at length with the patient today.  Concerns regarding medicines are outlined above.   Disposition: Follow-up with Dr. Harl Bowie or APP 2 months  Signed, Levell July, NP 01/21/2021 1:20 PM    [facility] [place], [place], [place] [address] Phone: [phone]; Fax: [phone]

## 2021-01-21 ENCOUNTER — Encounter: Payer: Self-pay | Admitting: Family Medicine

## 2021-01-21 ENCOUNTER — Other Ambulatory Visit: Payer: Self-pay

## 2021-01-21 ENCOUNTER — Ambulatory Visit: Payer: PPO | Admitting: Family Medicine

## 2021-01-21 ENCOUNTER — Other Ambulatory Visit: Payer: Self-pay | Admitting: *Deleted

## 2021-01-21 ENCOUNTER — Telehealth: Payer: Self-pay | Admitting: Family Medicine

## 2021-01-21 VITALS — BP 110/70 | HR 88 | Ht 67.5 in | Wt 248.8 lb

## 2021-01-21 DIAGNOSIS — I2510 Atherosclerotic heart disease of native coronary artery without angina pectoris: Secondary | ICD-10-CM

## 2021-01-21 DIAGNOSIS — I4891 Unspecified atrial fibrillation: Secondary | ICD-10-CM | POA: Diagnosis not present

## 2021-01-21 DIAGNOSIS — I824Y2 Acute embolism and thrombosis of unspecified deep veins of left proximal lower extremity: Secondary | ICD-10-CM | POA: Diagnosis not present

## 2021-01-21 DIAGNOSIS — I2699 Other pulmonary embolism without acute cor pulmonale: Secondary | ICD-10-CM | POA: Diagnosis not present

## 2021-01-21 NOTE — Patient Instructions (Addendum)
Medication Instructions:  Your physician recommends that you continue on your current medications as directed. Please refer to the Current Medication list given to you today.  Labwork: BMET 2 weeks before CT scan  Testing/Procedures: CT Angio Chest in September 2022 US Venous (DVT study) in September 2022  Follow-Up: Your physician recommends that you schedule a follow-up appointment in: 2 months  Any Other Special Instructions Will Be Listed Below (If Applicable).  If you need a refill on your cardiac medications before your next appointment, please call your pharmacy.

## 2021-01-21 NOTE — Telephone Encounter (Signed)
Checking percert on the following patient for testing scheduled at [facility].    CT ANGIO CHEST US VENOUS  02/23/2021

## 2021-02-02 DIAGNOSIS — I82409 Acute embolism and thrombosis of unspecified deep veins of unspecified lower extremity: Secondary | ICD-10-CM | POA: Diagnosis not present

## 2021-02-02 DIAGNOSIS — I2699 Other pulmonary embolism without acute cor pulmonale: Secondary | ICD-10-CM | POA: Diagnosis not present

## 2021-02-02 DIAGNOSIS — J019 Acute sinusitis, unspecified: Secondary | ICD-10-CM | POA: Diagnosis not present

## 2021-02-02 DIAGNOSIS — I2584 Coronary atherosclerosis due to calcified coronary lesion: Secondary | ICD-10-CM | POA: Diagnosis not present

## 2021-02-02 DIAGNOSIS — I4891 Unspecified atrial fibrillation: Secondary | ICD-10-CM | POA: Diagnosis not present

## 2021-02-02 DIAGNOSIS — I2510 Atherosclerotic heart disease of native coronary artery without angina pectoris: Secondary | ICD-10-CM | POA: Diagnosis not present

## 2021-02-02 DIAGNOSIS — R059 Cough, unspecified: Secondary | ICD-10-CM | POA: Diagnosis not present

## 2021-02-02 DIAGNOSIS — D72829 Elevated white blood cell count, unspecified: Secondary | ICD-10-CM | POA: Diagnosis not present

## 2021-02-09 ENCOUNTER — Other Ambulatory Visit: Payer: Self-pay | Admitting: Family Medicine

## 2021-02-09 DIAGNOSIS — I4891 Unspecified atrial fibrillation: Secondary | ICD-10-CM | POA: Diagnosis not present

## 2021-02-10 ENCOUNTER — Telehealth: Payer: Self-pay | Admitting: *Deleted

## 2021-02-10 LAB — BASIC METABOLIC PANEL WITH GFR
BUN: 21 mg/dL (ref 7–25)
CO2: 23 mmol/L (ref 20–32)
Calcium: 9.1 mg/dL (ref 8.6–10.3)
Chloride: 105 mmol/L (ref 98–110)
Creat: 1.01 mg/dL (ref 0.70–1.35)
Glucose, Bld: 141 mg/dL — ABNORMAL HIGH (ref 65–139)
Potassium: 4.4 mmol/L (ref 3.5–5.3)
Sodium: 136 mmol/L (ref 135–146)
eGFR: 81 mL/min/{1.73_m2} (ref >=60)

## 2021-02-10 NOTE — Telephone Encounter (Signed)
-----   Message from Verta Ellen., NP sent at 02/10/2021  7:56 AM EDT ----- Labs look good except for elevated blood glucose of 141. Verta Ellen, NP  02/10/2021 7:56 AM

## 2021-02-10 NOTE — Telephone Encounter (Signed)
Laurine Blazer, LPN  [id number]  [id number] PM EDT Back to Top    Notified, copy to pcp.    Laurine Blazer, LPN  [id number]  [id number] PM EDT     Left message to return call.

## 2021-02-23 ENCOUNTER — Other Ambulatory Visit: Payer: Self-pay

## 2021-02-23 ENCOUNTER — Ambulatory Visit (HOSPITAL_COMMUNITY)
Admission: RE | Admit: 2021-02-23 | Discharge: 2021-02-23 | Disposition: A | Discharge status: Home / Self Care | Payer: PPO | Source: Ambulatory Visit | Attending: Family Medicine | Admitting: Family Medicine

## 2021-02-23 DIAGNOSIS — K449 Diaphragmatic hernia without obstruction or gangrene: Secondary | ICD-10-CM | POA: Diagnosis not present

## 2021-02-23 DIAGNOSIS — I824Y2 Acute embolism and thrombosis of unspecified deep veins of left proximal lower extremity: Secondary | ICD-10-CM | POA: Diagnosis not present

## 2021-02-23 DIAGNOSIS — Z86711 Personal history of pulmonary embolism: Secondary | ICD-10-CM | POA: Diagnosis not present

## 2021-02-23 DIAGNOSIS — I2699 Other pulmonary embolism without acute cor pulmonale: Secondary | ICD-10-CM | POA: Insufficient documentation

## 2021-02-23 MED ORDER — IOHEXOL 350 MG/ML SOLN
100.0000 mL | Freq: Once | INTRAVENOUS | Status: AC | PRN
  Administered 2021-02-23: 80 mL via INTRAVENOUS

## 2021-02-24 ENCOUNTER — Telehealth: Payer: Self-pay | Admitting: *Deleted

## 2021-02-24 NOTE — Telephone Encounter (Signed)
-----   Message from Laurine Blazer, LPN sent at 075-GRM  6:02 PM EDT -----   ----- Message ----- From: Verta Ellen., NP Sent: 02/23/2021   5:49 PM EDT To: Laurine Blazer, LPN  Please call the patient and let him know the lower extremity DVT study did not show and evidence of a blood clot in either leg. Thank you  Verta Ellen, NP  02/23/2021 5:47 PM

## 2021-02-24 NOTE — Telephone Encounter (Signed)
Patient informed. Copy sent to PCP °

## 2021-02-24 NOTE — Telephone Encounter (Signed)
-----   Message from Laurine Blazer, LPN sent at 075-GRM  6:02 PM EDT -----   ----- Message ----- From: Verta Ellen., NP Sent: 02/23/2021   5:51 PM EDT To: Laurine Blazer, LPN  Please call the patient and let him know the CT of his chest showed no evidence of pulmonary embolism. The blood clot in his lung has resolved.   Verta Ellen, NP  02/23/2021 5:49 PM

## 2021-02-28 DIAGNOSIS — E7800 Pure hypercholesterolemia, unspecified: Secondary | ICD-10-CM | POA: Diagnosis not present

## 2021-02-28 DIAGNOSIS — Z125 Encounter for screening for malignant neoplasm of prostate: Secondary | ICD-10-CM | POA: Diagnosis not present

## 2021-02-28 DIAGNOSIS — D72829 Elevated white blood cell count, unspecified: Secondary | ICD-10-CM | POA: Diagnosis not present

## 2021-02-28 DIAGNOSIS — R5383 Other fatigue: Secondary | ICD-10-CM | POA: Diagnosis not present

## 2021-02-28 DIAGNOSIS — R739 Hyperglycemia, unspecified: Secondary | ICD-10-CM | POA: Diagnosis not present

## 2021-03-03 DIAGNOSIS — Z1331 Encounter for screening for depression: Secondary | ICD-10-CM | POA: Diagnosis not present

## 2021-03-03 DIAGNOSIS — I2584 Coronary atherosclerosis due to calcified coronary lesion: Secondary | ICD-10-CM | POA: Diagnosis not present

## 2021-03-03 DIAGNOSIS — Z1389 Encounter for screening for other disorder: Secondary | ICD-10-CM | POA: Diagnosis not present

## 2021-03-03 DIAGNOSIS — I82409 Acute embolism and thrombosis of unspecified deep veins of unspecified lower extremity: Secondary | ICD-10-CM | POA: Diagnosis not present

## 2021-03-03 DIAGNOSIS — I2699 Other pulmonary embolism without acute cor pulmonale: Secondary | ICD-10-CM | POA: Diagnosis not present

## 2021-03-03 DIAGNOSIS — D72829 Elevated white blood cell count, unspecified: Secondary | ICD-10-CM | POA: Diagnosis not present

## 2021-03-03 DIAGNOSIS — I4891 Unspecified atrial fibrillation: Secondary | ICD-10-CM | POA: Diagnosis not present

## 2021-03-03 DIAGNOSIS — Z0001 Encounter for general adult medical examination with abnormal findings: Secondary | ICD-10-CM | POA: Diagnosis not present

## 2021-03-16 DIAGNOSIS — I2510 Atherosclerotic heart disease of native coronary artery without angina pectoris: Secondary | ICD-10-CM | POA: Diagnosis not present

## 2021-03-16 DIAGNOSIS — I2584 Coronary atherosclerosis due to calcified coronary lesion: Secondary | ICD-10-CM | POA: Diagnosis not present

## 2021-03-16 DIAGNOSIS — R059 Cough, unspecified: Secondary | ICD-10-CM | POA: Diagnosis not present

## 2021-03-16 DIAGNOSIS — I4891 Unspecified atrial fibrillation: Secondary | ICD-10-CM | POA: Diagnosis not present

## 2021-03-16 DIAGNOSIS — D72829 Elevated white blood cell count, unspecified: Secondary | ICD-10-CM | POA: Diagnosis not present

## 2021-03-16 DIAGNOSIS — I82409 Acute embolism and thrombosis of unspecified deep veins of unspecified lower extremity: Secondary | ICD-10-CM | POA: Diagnosis not present

## 2021-03-16 DIAGNOSIS — I2699 Other pulmonary embolism without acute cor pulmonale: Secondary | ICD-10-CM | POA: Diagnosis not present

## 2021-03-16 DIAGNOSIS — R351 Nocturia: Secondary | ICD-10-CM | POA: Diagnosis not present

## 2021-03-30 ENCOUNTER — Telehealth: Payer: Self-pay | Admitting: Internal Medicine

## 2021-03-30 NOTE — Telephone Encounter (Signed)
Pt is on the Dec recall to have his 3 yr colonoscopy. He is wanting to schedule his procedure in January. I told him the December recall has not gone out yet and I would need to know if he needs OV or NV. He is anxious to have his procedure done ASAP in January. Please advise. [phone]

## 2021-03-31 ENCOUNTER — Encounter: Payer: Self-pay | Admitting: Internal Medicine

## 2021-03-31 NOTE — Telephone Encounter (Signed)
Pt will need ov due to blood thinners.

## 2021-04-06 NOTE — Progress Notes (Signed)
Cardiology Office Note  Date: 04/07/2021   ID: [name], [name] 07-21-1950, MRN [id number]  PCP:  Curlene Labrum, MD  Cardiologist:  Carlyle Dolly, MD Electrophysiologist:  None   Chief Complaint: Hospital follow up DVT / PE/ atrial fibrillation  History of Present Illness: Brian Leach is a 70 y.o. male with a history of gout.   He recently presented  to [facility] 11/27/2020 with a 4 to 5-day history of left-sided calf pain and dyspnea on exertion.  Initially went to see his PCP on 11/24/2020 due to the symptoms.  Venous duplex was ordered and was noted to be positive for DVT in left lower extremity.  Subsequently CTA of chest was ordered revealing bilateral pulmonary emboli with right ventricular strain.  He was advised to go to the emergency room.  He was started on IV heparin.  During his stay he was noted having SVT/atrial fibrillation.  He was started on Cardizem drip.  He was subsequently transitioned to p.o. diltiazem.  Cardiology was consulted..  Echocardiogram demonstrated EF of 60 to 65%.  No WMA's.  RV not well visualized.  He was transitioned to Eliquis 10 mg p.o. twice daily x7 days then 1 2 5  mg p.o. twice daily. Labs for factor V Leiden deficiency, lupus anticoagulant and prothrombin gene mutation were pending.  TSH was 2.8.   He was last here for follow-up on atrial fibrillation, DVT.  And PE.  He could occasionally feel palpitations.  He had been doing work outside without any significant issues.  No significant DOE or SOB.  No issues with tachycardia, denies any poor chest pain.  Swelling in his left leg was better.  He had been compliant with all of his medications including Eliquis without bleeding.  He continues on Cardizem 240 mg daily along with Eliquis 5 mg p.o. twice daily.  EKG today shows continuation with atrial fibrillation with a rate of 89.  He denies any anginal symptoms.  No orthostatic symptoms or CVA/TIA-like symptoms.  No PND or orthopnea.  No  claudication-like symptoms.  Had some minor mild edema in both lower extremities.  Dr. Harl Bowie had mentioned patient would need at least 3 months of therapy for recent DVT and PE before consideration of DC cardioversion for atrial fibrillation.  Atrial rate is controlled today.    He is here for follow-up today.  His recent lower venous study showed no evidence of DVT his recent follow-up CT scan showed no evidence of PE.  He remains in atrial fibrillation with EKG today demonstrating atrial fibrillation with a rate of 81, low voltage QRS, cannot rule out anterior infarct age undetermined.  He denies any DOE or SOB, PND, orthopnea.  Denies any orthostatic symptoms, no bleeding on Eliquis.  No claudication-like symptoms, DVT or PE-like symptoms.  No lower extremity edema.  We discussed the process of DC cardioversion.  Patient stated he would like to proceed.  He states he missed 2 doses of his Eliquis during the first Saturday of October but has not missed any further doses.  We discussed 3 weeks of uninterrupted anticoagulation before being able to proceed with DC cardioversion.  He verbalizes understanding.  We discussed the risk and benefits of cardioversion.   Past Medical History:  Diagnosis Date   Complication of anesthesia    difficult intubation   History of gout     Past Surgical History:  Procedure Laterality Date   COLONOSCOPY N/A 04/07/2013   Procedure: COLONOSCOPY;  Surgeon: Herbie Baltimore  Hilton Cork, MD;  [facility];  Service: Endoscopy;  Laterality: N/A;  7:30 AM   COLONOSCOPY N/A 05/14/2018   Procedure: COLONOSCOPY;  Surgeon: Daneil Dolin, MD;  Location: [facility];  Service: Endoscopy;  Laterality: N/A;  10:30   KNEE ARTHROSCOPY Left    POLYPECTOMY  05/14/2018   Procedure: POLYPECTOMY;  Surgeon: Daneil Dolin, MD;  Location: [facility];  Service: Endoscopy;;  colon     Current Outpatient Medications  Medication Sig Dispense Refill   apixaban (ELIQUIS) 5 MG TABS  tablet Take 5 mg by mouth 2 (two) times daily.     diltiazem (CARDIZEM CD) 240 MG 24 hr capsule Take 1 capsule (240 mg total) by mouth daily. 30 capsule 1   loratadine (CLARITIN) 10 MG tablet Take 10 mg by mouth as needed.     rosuvastatin (CRESTOR) 10 MG tablet Take 1 tablet (10 mg total) by mouth daily at 6 PM. 30 tablet 1   tamsulosin (FLOMAX) 0.4 MG CAPS capsule Take 0.4 mg by mouth daily.     No current facility-administered medications for this visit.   Allergies:  Codeine   Social History: The patient  reports that he has never smoked. He has never used smokeless tobacco. He reports that he does not drink alcohol and does not use drugs.   Family History: The patient's family history includes Heart disease in his brother.   ROS:  Please see the history of present illness. Otherwise, complete review of systems is positive for none.  All other systems are reviewed and negative.   Physical Exam: VS:  BP 132/70   Pulse 74   Ht 5' 7.5" (1.715 m)   Wt 243 lb 9.6 oz (110.5 kg)   SpO2 99%   BMI 37.59 kg/m , BMI Body mass index is 37.59 kg/m.  Wt Readings from Last 3 Encounters:  04/07/21 243 lb 9.6 oz (110.5 kg)  01/21/21 248 lb 12.8 oz (112.9 kg)  12/23/20 248 lb 6.4 oz (112.7 kg)    General: Obese patient appears comfortable at rest. Neck: Supple, no elevated JVP or carotid bruits, no thyromegaly. Lungs: Clear to auscultation, nonlabored breathing at rest. Cardiac: Irregularly irregular rate and rhythm, no S3 or significant systolic murmur, no pericardial rub. Extremities: Mild pitting edema, distal pulses 2+. Skin: Warm and dry. Musculoskeletal: No kyphosis. Neuropsychiatric: Alert and oriented x3, affect grossly appropriate.  ECG: 04/07/2021 atrial fibrillation with rate of 81, cannot rule out anterior infarct age undetermined.  Recent Labwork: 11/24/2020: ALT 25; AST 20; B Natriuretic Peptide 61.0 11/26/2020: Magnesium 2.2; TSH 2.891 11/27/2020: Hemoglobin 17.3; Platelets  233 02/09/2021: BUN 21; Creat 1.01; Potassium 4.4; Sodium 136     Component Value Date/Time   CHOL 186 11/26/2020 0540   TRIG 100 11/26/2020 0540   HDL 55 11/26/2020 0540   CHOLHDL 3.4 11/26/2020 0540   VLDL 20 11/26/2020 0540   LDLCALC 111 (H) 11/26/2020 0540    Other Studies Reviewed Today:  02/23/2021 CT chest with contrast IMPRESSION: 1. Interval recanalization of previously demonstrated pulmonary emboli. No evidence of acute pulmonary embolism. 2. No acute chest findings. 3. Heterogeneous hepatic steatosis.    Low venous study 02/23/2021 IMPRESSION: No evidence of deep venous thrombosis in either lower extremity. Previously identified thrombus in the left femoral, popliteal and calf veins has completely resolved with no residual thrombus identified.   Procedures/Studies: CT Angio Chest Pulmonary Embolism (PE) W or WO Contrast   Result Date: 11/24/2020 CLINICAL DATA:  Rule out pulmonary embolus.  DVT. EXAM: CT ANGIOGRAPHY CHEST WITH CONTRAST TECHNIQUE: Multidetector CT imaging of the chest was performed using the standard protocol during bolus administration of intravenous contrast. Multiplanar CT image reconstructions and MIPs were obtained to evaluate the vascular anatomy. CONTRAST:  127mL OMNIPAQUE IOHEXOL 350 MG/ML SOLN COMPARISON:  None. FINDINGS: Cardiovascular: There is a clot within the distal right main pulmonary artery which extends into the right upper lobe are and segmental pulmonary arteries compatible with acute pulmonary embolus. Segmental filling defects within the posterior left lower lobe also noted compatible with acute pulmonary embolus, image 202/5. Filling defects within segmental branches of the right middle lobe pulmonary artery and subsegmental branches of the right lower lobe pulmonary artery are also noted. The heart size appears normal.  The RV to LV ratio is equal to 1.03. Mild aortic atherosclerosis and mild coronary artery calcifications.  Mediastinum/Nodes: No enlarged mediastinal, hilar, or axillary lymph nodes. Thyroid gland, trachea, and esophagus demonstrate no significant findings. Small hiatal hernia. Lungs/Pleura: No pleural effusion. No airspace consolidation, atelectasis, or pneumothorax. Upper Abdomen: No acute abnormality within the imaged portions of the upper abdomen. Musculoskeletal: Degenerative disc disease identified. No acute or suspicious findings. Review of the MIP images confirms the above findings. IMPRESSION: 1. Examination is positive for acute bilateral pulmonary emboli. Positive for acute PE with of right heart strain (RV/LV Ratio 1.03) consistent with at least submassive (intermediate risk) PE. The presence of right heart strain has been associated with an increased risk of morbidity and mortality. 2. Aortic atherosclerosis. Coronary artery calcifications. Aortic Atherosclerosis (ICD10-I70.0). Critical Value/emergent results were called by telephone at the time of interpretation on 11/24/2020 at 2:57 pm to provider AMY BOYD , who verbally acknowledged these results. Electronically Signed   By: Kerby Moors M.D.   On: 11/24/2020 14:58   US Venous Img Lower Unilateral Left (DVT)   Result Date: 11/24/2020 CLINICAL DATA:  Pain, edema EXAM: LEFT LOWER EXTREMITY VENOUS DOPPLER ULTRASOUND TECHNIQUE: Gray-scale sonography with compression, as well as color and duplex ultrasound, were performed to evaluate the deep venous system(s) from the level of the common femoral vein through the popliteal and proximal calf veins. COMPARISON:  02/17/2005 by report only FINDINGS: VENOUS Normal compressibility of the common femoral, superficial femoral veins. The distal femoral vein is noncompressible with hypoechoic partially occlusive thrombus, with only a small amount of color flow signal identified. Similarly the popliteal vein is mostly occluded by hypoechoic thrombus. Noncompressible soleal, anterior tibial, and posterior tibial veins  in the calf. Limited views of the contralateral common femoral vein are unremarkable. OTHER None. Limitations: none IMPRESSION: 1. POSITIVE for occlusive DVT in left calf extending through the popliteal vein into the distal femoral vein. These results will be called to the ordering clinician or representative by the Radiology Department at the imaging location. Electronically Signed   By: Lucrezia Europe M.D.   On: 11/24/2020 11:54   ECHOCARDIOGRAM COMPLETE   Result Date: 11/25/2020    ECHOCARDIOGRAM REPORT   Patient Name:   DOMENICO ACHORD Date of Exam: 11/25/2020 Medical Rec #:  [id number]       Height:       67.0 in Accession #:    [id number]      Weight:       234.1 lb Date of Birth:  [birth year]       BSA:          2.162 m Patient Age:    56 years  BP:           118/56 mmHg Patient Gender: M               HR:           69 bpm. Exam Location:  [facility] Procedure: 2D Echo, Cardiac Doppler, Color Doppler and Intracardiac            Opacification Agent Indications:    Pulmonary embolus  History:        Patient has no prior history of Echocardiogram examinations.                 DVT, Arrythmias:Atrial Fibrillation, Signs/Symptoms:Pulmonary                 embolus; Risk Factors:Obesity.  Sonographer:    Dustin Flock RDCS Referring Phys: [phone] Leanne Chang [facility]  Sonographer Comments: Patient is morbidly obese. Image acquisition challenging due to respiratory motion and Image acquisition challenging due to patient body habitus. IMPRESSIONS  1. Left ventricular ejection fraction, by estimation, is 60 to 65%. The left ventricle has normal function. The left ventricle has no regional wall motion abnormalities. There is mild left ventricular hypertrophy. Left ventricular diastolic parameters are indeterminate.  2. Right ventricular systolic function was not well visualized. The right ventricular size is not well visualized.  3. The mitral valve is normal in structure. No evidence of mitral valve regurgitation.  No evidence of mitral stenosis.  4. The aortic valve has an indeterminant number of cusps. Aortic valve regurgitation is not visualized. No aortic stenosis is present. FINDINGS  Left Ventricle: Left ventricular ejection fraction, by estimation, is 60 to 65%. The left ventricle has normal function. The left ventricle has no regional wall motion abnormalities. Definity contrast agent was given IV to delineate the left ventricular  endocardial borders. The left ventricular internal cavity size was normal in size. There is mild left ventricular hypertrophy. Left ventricular diastolic parameters are indeterminate. Right Ventricle: The right ventricular size is not well visualized. Right vetricular wall thickness was not well visualized. Right ventricular systolic function was not well visualized. Left Atrium: Left atrial size was not well visualized. Right Atrium: Right atrial size was not well visualized. Pericardium: There is no evidence of pericardial effusion. Mitral Valve: The mitral valve is normal in structure. No evidence of mitral valve regurgitation. No evidence of mitral valve stenosis. Tricuspid Valve: The tricuspid valve is normal in structure. Tricuspid valve regurgitation is not demonstrated. No evidence of tricuspid stenosis. Aortic Valve: The aortic valve has an indeterminant number of cusps. Aortic valve regurgitation is not visualized. No aortic stenosis is present. Aortic valve mean gradient measures 4.0 mmHg. Aortic valve peak gradient measures 8.7 mmHg. Aortic valve area, by VTI measures 3.96 cm. Pulmonic Valve: The pulmonic valve was not well visualized. Pulmonic valve regurgitation is not visualized. No evidence of pulmonic stenosis. Aorta: The aortic root is normal in size and structure. IAS/Shunts: The interatrial septum was not well visualized.  LEFT VENTRICLE PLAX 2D LVIDd:         5.62 cm  Diastology LVIDs:         3.48 cm  LV e' medial:    8.24 cm/s LV PW:         1.32 cm  LV E/e' medial:   10.1 LV IVS:        1.30 cm  LV e' lateral:   9.64 cm/s LVOT diam:     2.50 cm  LV E/e' lateral: 8.7 LV  SV:         89 LV SV Index:   41 LVOT Area:     4.91 cm  RIGHT VENTRICLE RV Basal diam:  3.94 cm RV S prime:     12.40 cm/s LEFT ATRIUM             Index       RIGHT ATRIUM           Index LA diam:        3.60 cm 1.66 cm/m  RA Area:     20.50 cm LA Vol (A2C):   76.0 ml 35.15 ml/m RA Volume:   62.60 ml  28.95 ml/m LA Vol (A4C):   86.6 ml 40.05 ml/m LA Biplane Vol: 89.3 ml 41.30 ml/m  AORTIC VALVE AV Area (Vmax):    3.30 cm AV Area (Vmean):   3.87 cm AV Area (VTI):     3.96 cm AV Vmax:           147.68 cm/s AV Vmean:          90.188 cm/s AV VTI:            0.225 m AV Peak Grad:      8.7 mmHg AV Mean Grad:      4.0 mmHg LVOT Vmax:         99.30 cm/s LVOT Vmean:        71.100 cm/s LVOT VTI:          0.182 m LVOT/AV VTI ratio: 0.81  AORTA Ao Root diam: 3.20 cm MITRAL VALVE MV Area (PHT): 3.76 cm    SHUNTS MV Decel Time: 202 msec    Systemic VTI:  0.18 m MV E velocity: 83.40 cm/s  Systemic Diam: 2.50 cm MV A velocity: 79.60 cm/s MV E/A ratio:  1.05 Carlyle Dolly MD Electronically signed by Carlyle Dolly MD Signature Date/Time: 11/25/2020/4:28:06 PM    Final        Assessment and Plan:  1. Acute deep vein thrombosis (DVT) of proximal vein of left lower extremity ([place])   2. Acute pulmonary embolism, unspecified pulmonary embolism type, unspecified whether acute cor pulmonale present ([place])   3. Atrial fibrillation, unspecified type ([place])   4. Coronary artery calcification seen on CT scan   5. Mixed hyperlipidemia      1. Acute deep vein thrombosis (DVT) of proximal vein of left lower extremity ([facility]) LLE DVT (from L Popliteal vein to distal femoral vein).   He was negative for lupus anticoagulant disorder, factor V Leiden deficiency, and prothrombin gene mutation per labs.  Recent follow-up lower extremity DVT study shows no evidence of DVT.   2. Acute pulmonary embolism, unspecified pulmonary  embolism type, unspecified whether acute cor pulmonale present [facility]) Recent follow-up CT scan showed no evidence of pulmonary embolus.  He continues on anticoagulation.  He denies any DOE or SOB.  Denies any tachycardia.  Denies any pleuritic chest pain.  3. Atrial fibrillation, unspecified type ([place]) He continues in atrial fibrillation with a rate of 81.  He is continuing Eliquis 5 mg p.o. twice daily.  He states in October he missed 2 doses of his Eliquis, 1 dose on 03/19/2021 and 1 dose on 03/26/2021.  He has not missed any doses since that time.  He is continuing his Cardizem CD 240 mg p.o. daily.  I discussed with him that I would talk to his primary cardiologist for scheduling DC cardioversion.  We discussed risk and benefits.  He is willing  to proceed.  4. Coronary artery calcification by CT CTA chest with aortic atherosclerosis and coronary artery calcifications. Troponins during hospitalizations were negative.  He currently denies any anginal symptoms or shortness of breath/DOE.  5.  Hyperlipidemia Continue Crestor 10 mg daily.  Lipid panel on 11/26/2020: TC 186, TG 100, HDL 55, LDL 111.  Medication Adjustments/Labs and Tests Ordered: Current medicines are reviewed at length with the patient today.  Concerns regarding medicines are outlined above.   Disposition: Follow-up with Dr. Harl Bowie or APP 1 month after DC cardioversion  Signed, Levell July, NP 04/07/2021 12:21 PM    [facility] [place], [place], [place] [address] Phone: [phone]; Fax: [phone]

## 2021-04-06 NOTE — H&P (View-Only) (Signed)
Cardiology Office Note  Date: 04/07/2021   ID: Brian, Leach [birth year], MRN [id number]  PCP:  Curlene Labrum, MD  Cardiologist:  Carlyle Dolly, MD Electrophysiologist:  None   Chief Complaint: Hospital follow up DVT / PE/ atrial fibrillation  History of Present Illness: Brian Leach is a 70 y.o. male with a history of gout.   He recently presented  to [place] 11/27/2020 with a 4 to 5-day history of left-sided calf pain and dyspnea on exertion.  Initially went to see his PCP on 11/24/2020 due to the symptoms.  Venous duplex was ordered and was noted to be positive for DVT in left lower extremity.  Subsequently CTA of chest was ordered revealing bilateral pulmonary emboli with right ventricular strain.  He was advised to go to the emergency room.  He was started on IV heparin.  During his stay he was noted having SVT/atrial fibrillation.  He was started on Cardizem drip.  He was subsequently transitioned to p.o. diltiazem.  Cardiology was consulted..  Echocardiogram demonstrated EF of 60 to 65%.  No WMA's.  RV not well visualized.  He was transitioned to Eliquis 10 mg p.o. twice daily x7 days then 1 2 5  mg p.o. twice daily. Labs for factor V Leiden deficiency, lupus anticoagulant and prothrombin gene mutation were pending.  TSH was 2.8.   He was last here for follow-up on atrial fibrillation, DVT.  And PE.  He could occasionally feel palpitations.  He had been doing work outside without any significant issues.  No significant DOE or SOB.  No issues with tachycardia, denies any poor chest pain.  Swelling in his left leg was better.  He had been compliant with all of his medications including Eliquis without bleeding.  He continues on Cardizem 240 mg daily along with Eliquis 5 mg p.o. twice daily.  EKG today shows continuation with atrial fibrillation with a rate of 89.  He denies any anginal symptoms.  No orthostatic symptoms or CVA/TIA-like symptoms.  No PND or orthopnea.  No  claudication-like symptoms.  Had some minor mild edema in both lower extremities.  Dr. Harl Bowie had mentioned patient would need at least 3 months of therapy for recent DVT and PE before consideration of DC cardioversion for atrial fibrillation.  Atrial rate is controlled today.    He is here for follow-up today.  His recent lower venous study showed no evidence of DVT his recent follow-up CT scan showed no evidence of PE.  He remains in atrial fibrillation with EKG today demonstrating atrial fibrillation with a rate of 81, low voltage QRS, cannot rule out anterior infarct age undetermined.  He denies any DOE or SOB, PND, orthopnea.  Denies any orthostatic symptoms, no bleeding on Eliquis.  No claudication-like symptoms, DVT or PE-like symptoms.  No lower extremity edema.  We discussed the process of DC cardioversion.  Patient stated he would like to proceed.  He states he missed 2 doses of his Eliquis during the first Saturday of October but has not missed any further doses.  We discussed 3 weeks of uninterrupted anticoagulation before being able to proceed with DC cardioversion.  He verbalizes understanding.  We discussed the risk and benefits of cardioversion.   Past Medical History:  Diagnosis Date   Complication of anesthesia    difficult intubation   History of gout     Past Surgical History:  Procedure Laterality Date   COLONOSCOPY N/A 04/07/2013   Procedure: COLONOSCOPY;  Surgeon: Herbie Baltimore  Hilton Cork, MD;  [facility];  Service: Endoscopy;  Laterality: N/A;  7:30 AM   COLONOSCOPY N/A 05/14/2018   Procedure: COLONOSCOPY;  Surgeon: Daneil Dolin, MD;  Location: [facility];  Service: Endoscopy;  Laterality: N/A;  10:30   KNEE ARTHROSCOPY Left    POLYPECTOMY  05/14/2018   Procedure: POLYPECTOMY;  Surgeon: Daneil Dolin, MD;  Location: [facility];  Service: Endoscopy;;  colon     Current Outpatient Medications  Medication Sig Dispense Refill   apixaban (ELIQUIS) 5 MG TABS  tablet Take 5 mg by mouth 2 (two) times daily.     diltiazem (CARDIZEM CD) 240 MG 24 hr capsule Take 1 capsule (240 mg total) by mouth daily. 30 capsule 1   loratadine (CLARITIN) 10 MG tablet Take 10 mg by mouth as needed.     rosuvastatin (CRESTOR) 10 MG tablet Take 1 tablet (10 mg total) by mouth daily at 6 PM. 30 tablet 1   tamsulosin (FLOMAX) 0.4 MG CAPS capsule Take 0.4 mg by mouth daily.     No current facility-administered medications for this visit.   Allergies:  Codeine   Social History: The patient  reports that he has never smoked. He has never used smokeless tobacco. He reports that he does not drink alcohol and does not use drugs.   Family History: The patient's family history includes Heart disease in his brother.   ROS:  Please see the history of present illness. Otherwise, complete review of systems is positive for none.  All other systems are reviewed and negative.   Physical Exam: VS:  BP 132/70   Pulse 74   Ht 5' 7.5" (1.715 m)   Wt 243 lb 9.6 oz (110.5 kg)   SpO2 99%   BMI 37.59 kg/m , BMI Body mass index is 37.59 kg/m.  Wt Readings from Last 3 Encounters:  04/07/21 243 lb 9.6 oz (110.5 kg)  01/21/21 248 lb 12.8 oz (112.9 kg)  12/23/20 248 lb 6.4 oz (112.7 kg)    General: Obese patient appears comfortable at rest. Neck: Supple, no elevated JVP or carotid bruits, no thyromegaly. Lungs: Clear to auscultation, nonlabored breathing at rest. Cardiac: Irregularly irregular rate and rhythm, no S3 or significant systolic murmur, no pericardial rub. Extremities: Mild pitting edema, distal pulses 2+. Skin: Warm and dry. Musculoskeletal: No kyphosis. Neuropsychiatric: Alert and oriented x3, affect grossly appropriate.  ECG: 04/07/2021 atrial fibrillation with rate of 81, cannot rule out anterior infarct age undetermined.  Recent Labwork: 11/24/2020: ALT 25; AST 20; B Natriuretic Peptide 61.0 11/26/2020: Magnesium 2.2; TSH 2.891 11/27/2020: Hemoglobin 17.3; Platelets  233 02/09/2021: BUN 21; Creat 1.01; Potassium 4.4; Sodium 136     Component Value Date/Time   CHOL 186 11/26/2020 0540   TRIG 100 11/26/2020 0540   HDL 55 11/26/2020 0540   CHOLHDL 3.4 11/26/2020 0540   VLDL 20 11/26/2020 0540   LDLCALC 111 (H) 11/26/2020 0540    Other Studies Reviewed Today:  02/23/2021 CT chest with contrast IMPRESSION: 1. Interval recanalization of previously demonstrated pulmonary emboli. No evidence of acute pulmonary embolism. 2. No acute chest findings. 3. Heterogeneous hepatic steatosis.    Low venous study 02/23/2021 IMPRESSION: No evidence of deep venous thrombosis in either lower extremity. Previously identified thrombus in the left femoral, popliteal and calf veins has completely resolved with no residual thrombus identified.   Procedures/Studies: CT Angio Chest Pulmonary Embolism (PE) W or WO Contrast   Result Date: 11/24/2020 CLINICAL DATA:  Rule out pulmonary embolus.  DVT. EXAM: CT ANGIOGRAPHY CHEST WITH CONTRAST TECHNIQUE: Multidetector CT imaging of the chest was performed using the standard protocol during bolus administration of intravenous contrast. Multiplanar CT image reconstructions and MIPs were obtained to evaluate the vascular anatomy. CONTRAST:  154mL OMNIPAQUE IOHEXOL 350 MG/ML SOLN COMPARISON:  None. FINDINGS: Cardiovascular: There is a clot within the distal right main pulmonary artery which extends into the right upper lobe are and segmental pulmonary arteries compatible with acute pulmonary embolus. Segmental filling defects within the posterior left lower lobe also noted compatible with acute pulmonary embolus, image 202/5. Filling defects within segmental branches of the right middle lobe pulmonary artery and subsegmental branches of the right lower lobe pulmonary artery are also noted. The heart size appears normal.  The RV to LV ratio is equal to 1.03. Mild aortic atherosclerosis and mild coronary artery calcifications.  Mediastinum/Nodes: No enlarged mediastinal, hilar, or axillary lymph nodes. Thyroid gland, trachea, and esophagus demonstrate no significant findings. Small hiatal hernia. Lungs/Pleura: No pleural effusion. No airspace consolidation, atelectasis, or pneumothorax. Upper Abdomen: No acute abnormality within the imaged portions of the upper abdomen. Musculoskeletal: Degenerative disc disease identified. No acute or suspicious findings. Review of the MIP images confirms the above findings. IMPRESSION: 1. Examination is positive for acute bilateral pulmonary emboli. Positive for acute PE with of right heart strain (RV/LV Ratio 1.03) consistent with at least submassive (intermediate risk) PE. The presence of right heart strain has been associated with an increased risk of morbidity and mortality. 2. Aortic atherosclerosis. Coronary artery calcifications. Aortic Atherosclerosis (ICD10-I70.0). Critical Value/emergent results were called by telephone at the time of interpretation on 11/24/2020 at 2:57 pm to provider AMY BOYD , who verbally acknowledged these results. Electronically Signed   By: Kerby Moors M.D.   On: 11/24/2020 14:58   US Venous Img Lower Unilateral Left (DVT)   Result Date: 11/24/2020 CLINICAL DATA:  Pain, edema EXAM: LEFT LOWER EXTREMITY VENOUS DOPPLER ULTRASOUND TECHNIQUE: Gray-scale sonography with compression, as well as color and duplex ultrasound, were performed to evaluate the deep venous system(s) from the level of the common femoral vein through the popliteal and proximal calf veins. COMPARISON:  02/17/2005 by report only FINDINGS: VENOUS Normal compressibility of the common femoral, superficial femoral veins. The distal femoral vein is noncompressible with hypoechoic partially occlusive thrombus, with only a small amount of color flow signal identified. Similarly the popliteal vein is mostly occluded by hypoechoic thrombus. Noncompressible soleal, anterior tibial, and posterior tibial veins  in the calf. Limited views of the contralateral common femoral vein are unremarkable. OTHER None. Limitations: none IMPRESSION: 1. POSITIVE for occlusive DVT in left calf extending through the popliteal vein into the distal femoral vein. These results will be called to the ordering clinician or representative by the Radiology Department at the imaging location. Electronically Signed   By: Lucrezia Europe M.D.   On: 11/24/2020 11:54   ECHOCARDIOGRAM COMPLETE   Result Date: 11/25/2020    ECHOCARDIOGRAM REPORT   Patient Name:   DESTEN MANOR Date of Exam: 11/25/2020 Medical Rec #:  [id number]       Height:       67.0 in Accession #:    [id number]      Weight:       234.1 lb Date of Birth:  Mar 16, 1951       BSA:          2.162 m Patient Age:    89 years  BP:           118/56 mmHg Patient Gender: M               HR:           69 bpm. Exam Location:  [facility] Procedure: 2D Echo, Cardiac Doppler, Color Doppler and Intracardiac            Opacification Agent Indications:    Pulmonary embolus  History:        Patient has no prior history of Echocardiogram examinations.                 DVT, Arrythmias:Atrial Fibrillation, Signs/Symptoms:Pulmonary                 embolus; Risk Factors:Obesity.  Sonographer:    Dustin Flock RDCS Referring Phys: [phone] Leanne Chang [facility]  Sonographer Comments: Patient is morbidly obese. Image acquisition challenging due to respiratory motion and Image acquisition challenging due to patient body habitus. IMPRESSIONS  1. Left ventricular ejection fraction, by estimation, is 60 to 65%. The left ventricle has normal function. The left ventricle has no regional wall motion abnormalities. There is mild left ventricular hypertrophy. Left ventricular diastolic parameters are indeterminate.  2. Right ventricular systolic function was not well visualized. The right ventricular size is not well visualized.  3. The mitral valve is normal in structure. No evidence of mitral valve regurgitation.  No evidence of mitral stenosis.  4. The aortic valve has an indeterminant number of cusps. Aortic valve regurgitation is not visualized. No aortic stenosis is present. FINDINGS  Left Ventricle: Left ventricular ejection fraction, by estimation, is 60 to 65%. The left ventricle has normal function. The left ventricle has no regional wall motion abnormalities. Definity contrast agent was given IV to delineate the left ventricular  endocardial borders. The left ventricular internal cavity size was normal in size. There is mild left ventricular hypertrophy. Left ventricular diastolic parameters are indeterminate. Right Ventricle: The right ventricular size is not well visualized. Right vetricular wall thickness was not well visualized. Right ventricular systolic function was not well visualized. Left Atrium: Left atrial size was not well visualized. Right Atrium: Right atrial size was not well visualized. Pericardium: There is no evidence of pericardial effusion. Mitral Valve: The mitral valve is normal in structure. No evidence of mitral valve regurgitation. No evidence of mitral valve stenosis. Tricuspid Valve: The tricuspid valve is normal in structure. Tricuspid valve regurgitation is not demonstrated. No evidence of tricuspid stenosis. Aortic Valve: The aortic valve has an indeterminant number of cusps. Aortic valve regurgitation is not visualized. No aortic stenosis is present. Aortic valve mean gradient measures 4.0 mmHg. Aortic valve peak gradient measures 8.7 mmHg. Aortic valve area, by VTI measures 3.96 cm. Pulmonic Valve: The pulmonic valve was not well visualized. Pulmonic valve regurgitation is not visualized. No evidence of pulmonic stenosis. Aorta: The aortic root is normal in size and structure. IAS/Shunts: The interatrial septum was not well visualized.  LEFT VENTRICLE PLAX 2D LVIDd:         5.62 cm  Diastology LVIDs:         3.48 cm  LV e' medial:    8.24 cm/s LV PW:         1.32 cm  LV E/e' medial:   10.1 LV IVS:        1.30 cm  LV e' lateral:   9.64 cm/s LVOT diam:     2.50 cm  LV E/e' lateral: 8.7 LV  SV:         89 LV SV Index:   41 LVOT Area:     4.91 cm  RIGHT VENTRICLE RV Basal diam:  3.94 cm RV S prime:     12.40 cm/s LEFT ATRIUM             Index       RIGHT ATRIUM           Index LA diam:        3.60 cm 1.66 cm/m  RA Area:     20.50 cm LA Vol (A2C):   76.0 ml 35.15 ml/m RA Volume:   62.60 ml  28.95 ml/m LA Vol (A4C):   86.6 ml 40.05 ml/m LA Biplane Vol: 89.3 ml 41.30 ml/m  AORTIC VALVE AV Area (Vmax):    3.30 cm AV Area (Vmean):   3.87 cm AV Area (VTI):     3.96 cm AV Vmax:           147.68 cm/s AV Vmean:          90.188 cm/s AV VTI:            0.225 m AV Peak Grad:      8.7 mmHg AV Mean Grad:      4.0 mmHg LVOT Vmax:         99.30 cm/s LVOT Vmean:        71.100 cm/s LVOT VTI:          0.182 m LVOT/AV VTI ratio: 0.81  AORTA Ao Root diam: 3.20 cm MITRAL VALVE MV Area (PHT): 3.76 cm    SHUNTS MV Decel Time: 202 msec    Systemic VTI:  0.18 m MV E velocity: 83.40 cm/s  Systemic Diam: 2.50 cm MV A velocity: 79.60 cm/s MV E/A ratio:  1.05 Carlyle Dolly MD Electronically signed by Carlyle Dolly MD Signature Date/Time: 11/25/2020/4:28:06 PM    Final        Assessment and Plan:  1. Acute deep vein thrombosis (DVT) of proximal vein of left lower extremity ([place])   2. Acute pulmonary embolism, unspecified pulmonary embolism type, unspecified whether acute cor pulmonale present ([place])   3. Atrial fibrillation, unspecified type ([place])   4. Coronary artery calcification seen on CT scan   5. Mixed hyperlipidemia      1. Acute deep vein thrombosis (DVT) of proximal vein of left lower extremity ([facility]) LLE DVT (from L Popliteal vein to distal femoral vein).   He was negative for lupus anticoagulant disorder, factor V Leiden deficiency, and prothrombin gene mutation per labs.  Recent follow-up lower extremity DVT study shows no evidence of DVT.   2. Acute pulmonary embolism, unspecified pulmonary  embolism type, unspecified whether acute cor pulmonale present [facility]) Recent follow-up CT scan showed no evidence of pulmonary embolus.  He continues on anticoagulation.  He denies any DOE or SOB.  Denies any tachycardia.  Denies any pleuritic chest pain.  3. Atrial fibrillation, unspecified type ([place]) He continues in atrial fibrillation with a rate of 81.  He is continuing Eliquis 5 mg p.o. twice daily.  He states in October he missed 2 doses of his Eliquis, 1 dose on 03/19/2021 and 1 dose on 03/26/2021.  He has not missed any doses since that time.  He is continuing his Cardizem CD 240 mg p.o. daily.  I discussed with him that I would talk to his primary cardiologist for scheduling DC cardioversion.  We discussed risk and benefits.  He is willing  to proceed.  4. Coronary artery calcification by CT CTA chest with aortic atherosclerosis and coronary artery calcifications. Troponins during hospitalizations were negative.  He currently denies any anginal symptoms or shortness of breath/DOE.  5.  Hyperlipidemia Continue Crestor 10 mg daily.  Lipid panel on 11/26/2020: TC 186, TG 100, HDL 55, LDL 111.  Medication Adjustments/Labs and Tests Ordered: Current medicines are reviewed at length with the patient today.  Concerns regarding medicines are outlined above.   Disposition: Follow-up with Dr. Harl Bowie or APP 1 month after DC cardioversion  Signed, Levell July, NP 04/07/2021 12:21 PM    [facility] [place], [place], [place] [address] Phone: [phone]; Fax: [phone]

## 2021-04-07 ENCOUNTER — Encounter: Payer: Self-pay | Admitting: *Deleted

## 2021-04-07 ENCOUNTER — Encounter: Payer: Self-pay | Admitting: Family Medicine

## 2021-04-07 ENCOUNTER — Other Ambulatory Visit: Payer: Self-pay

## 2021-04-07 ENCOUNTER — Ambulatory Visit: Payer: PPO | Admitting: Family Medicine

## 2021-04-07 VITALS — BP 132/70 | HR 74 | Ht 67.5 in | Wt 243.6 lb

## 2021-04-07 DIAGNOSIS — I2510 Atherosclerotic heart disease of native coronary artery without angina pectoris: Secondary | ICD-10-CM

## 2021-04-07 DIAGNOSIS — I4891 Unspecified atrial fibrillation: Secondary | ICD-10-CM | POA: Diagnosis not present

## 2021-04-07 DIAGNOSIS — E782 Mixed hyperlipidemia: Secondary | ICD-10-CM

## 2021-04-07 DIAGNOSIS — I824Y2 Acute embolism and thrombosis of unspecified deep veins of left proximal lower extremity: Secondary | ICD-10-CM

## 2021-04-07 DIAGNOSIS — I2699 Other pulmonary embolism without acute cor pulmonale: Secondary | ICD-10-CM

## 2021-04-07 NOTE — Patient Instructions (Addendum)
Medication Instructions:  Continue all current medications.  Labwork: none  Testing/Procedures: Your physician has recommended that you have a Cardioversion (DCCV). Electrical Cardioversion uses a jolt of electricity to your heart either through paddles or wired patches attached to your chest. This is a controlled, usually prescheduled, procedure. Defibrillation is done under light anesthesia in the hospital, and you usually go home the day of the procedure. This is done to get your heart back into a normal rhythm. You are not awake for the procedure. Please see the instruction sheet given to you today - pending scheduling.   Follow-Up: 1 month after cardioversion.     Any Other Special Instructions Will Be Listed Below (If Applicable).   If you need a refill on your cardiac medications before your next appointment, please call your pharmacy.

## 2021-04-13 ENCOUNTER — Encounter: Payer: Self-pay | Admitting: Internal Medicine

## 2021-04-13 ENCOUNTER — Telehealth: Payer: Self-pay | Admitting: Family Medicine

## 2021-04-13 NOTE — Telephone Encounter (Signed)
Pt states that cardioversion was discussed , per notes this should be done after three weeks of anticoagulation. Pt would like to know when he should be looking to hear back in order to get this scheduled....  Advised pt per NP Levell July "When i saw him he had recently missed 2 doses of anticoagulant.  I told him he needed 3 uninterrupted weeks of taking the medication. Obviously it would be 3 weeks from the date i saw him."  Edu pt that he would be contacted when it is time to schedule.. please advise if there is anything additional I missed.. thanks!

## 2021-04-14 NOTE — Telephone Encounter (Signed)
Okay per Katina Dung, NP to go ahead with scheduling the cardioversion.

## 2021-04-15 NOTE — Progress Notes (Signed)
Would need to be 3 weeks without any missed doses, if has not missed any futher doses since Oct 15 could do next week  Zandra Abts MD

## 2021-04-18 ENCOUNTER — Telehealth: Payer: Self-pay | Admitting: Cardiology

## 2021-04-18 ENCOUNTER — Encounter: Payer: Self-pay | Admitting: *Deleted

## 2021-04-18 NOTE — Telephone Encounter (Signed)
DCCV scheduled for Wednesday, 04/27/2021 at 9:30 am - to arrive at 8:00 am.    Pre-op day - Monday, 04/25/2021 at 1:15 pm.   Instruction sheet sent to patient via my chart.

## 2021-04-18 NOTE — Telephone Encounter (Signed)
PERCERT:  DCCV scheduled for Wednesday, 11/16 at 9:30 - Dr. Domenic Polite - [place]

## 2021-04-21 NOTE — Patient Instructions (Signed)
Your procedure is scheduled on: 04/27/2021  Report to [facility] at   8:00  AM.  Call this number if you have problems the morning of surgery: [phone]   Remember:   Do not Eat or Drink after midnight         No Smoking the morning of surgery  :  Take these medicines the morning of surgery with A SIP OF WATER: Diltiazem and flomax               Do not miss any doses of Eliquis   Do not wear jewelry, make-up or nail polish.  Do not wear lotions, powders, or perfumes. You may wear deodorant.    Do not bring valuables to the hospital.  Contacts, dentures or bridgework may not be worn into surgery.  Leave suitcase in the car. After surgery it may be brought to your room.  For patients admitted to the hospital, checkout time is 11:00 AM the day of discharge.   Patients discharged the day of surgery will not be allowed to drive home.   Electrical Cardioversion Electrical cardioversion is the delivery of a jolt of electricity to restore a normal rhythm to the heart. A rhythm that is too fast or is not regular keeps the heart from pumping well. In this procedure, sticky patches or metal paddles are placed on the chest to deliver electricity to the heart from a device. This procedure may be done in an emergency if: There is low or no blood pressure as a result of the heart rhythm. Normal rhythm must be restored as fast as possible to protect the brain and heart from further damage. It may save a life. This may also be a scheduled procedure for irregular or fast heart rhythms that are not immediately life-threatening. Tell a health care provider about: Any allergies you have. All medicines you are taking, including vitamins, herbs, eye drops, creams, and over-the-counter medicines. Any problems you or family members have had with anesthetic medicines. Any blood disorders you have. Any surgeries you have had. Any medical conditions you have. Whether you are pregnant or  may be pregnant. What are the risks? Generally, this is a safe procedure. However, problems may occur, including: Allergic reactions to medicines. A blood clot that breaks free and travels to other parts of your body. The possible return of an abnormal heart rhythm within hours or days after the procedure. Your heart stopping (cardiac arrest). This is rare. What happens before the procedure? Medicines Your health care provider may have you start taking: Blood-thinning medicines (anticoagulants) so your blood does not clot as easily. Medicines to help stabilize your heart rate and rhythm. Ask your health care provider about: Changing or stopping your regular medicines. This is especially important if you are taking diabetes medicines or blood thinners. Taking medicines such as aspirin and ibuprofen. These medicines can thin your blood. Do not take these medicines unless your health care provider tells you to take them. Taking over-the-counter medicines, vitamins, herbs, and supplements. General instructions Follow instructions from your health care provider about eating or drinking restrictions. Plan to have someone take you home from the hospital or clinic. If you will be going home right after the procedure, plan to have someone with you for 24 hours. Ask your health care provider what steps will be taken to help prevent infection. These may include washing your skin with a germ-killing soap. What happens during the procedure?  An IV will be  inserted into one of your veins. Sticky patches (electrodes) or metal paddles may be placed on your chest. You will be given a medicine to help you relax (sedative). An electrical shock will be delivered. The procedure may vary among health care providers and hospitals. What can I expect after the procedure? Your blood pressure, heart rate, breathing rate, and blood oxygen level will be monitored until you leave the hospital or clinic. Your heart  rhythm will be watched to make sure it does not change. You may have some redness on the skin where the shocks were given. Follow these instructions at home: Do not drive for 24 hours if you were given a sedative during your procedure. Take over-the-counter and prescription medicines only as told by your health care provider. Ask your health care provider how to check your pulse. Check it often. Rest for 48 hours after the procedure or as told by your health care provider. Avoid or limit your caffeine use as told by your health care provider. Keep all follow-up visits as told by your health care provider. This is important. Contact a health care provider if: You feel like your heart is beating too quickly or your pulse is not regular. You have a serious muscle cramp that does not go away. Get help right away if: You have discomfort in your chest. You are dizzy or you feel faint. You have trouble breathing or you are short of breath. Your speech is slurred. You have trouble moving an arm or leg on one side of your body. Your fingers or toes turn cold or blue. Summary Electrical cardioversion is the delivery of a jolt of electricity to restore a normal rhythm to the heart. This procedure may be done right away in an emergency or may be a scheduled procedure if the condition is not an emergency. Generally, this is a safe procedure. After the procedure, check your pulse often as told by your health care provider. This information is not intended to replace advice given to you by your health care provider. Make sure you discuss any questions you have with your health care provider. Document Revised: 12/30/2018 Document Reviewed: 12/30/2018 Elsevier Patient Education  [employer] Anesthesia, Adult, Care After This sheet gives you information about how to care for yourself after your procedure. Your health care provider may also give you more specific instructions. If you have  problems or questions, contact your health care provider. What can I expect after the procedure? After the procedure, the following side effects are common: Pain or discomfort at the IV site. Nausea. Vomiting. Sore throat. Trouble concentrating. Feeling cold or chills. Feeling weak or tired. Sleepiness and fatigue. Soreness and body aches. These side effects can affect parts of the body that were not involved in surgery. Follow these instructions at home: For the time period you were told by your health care provider:  Rest. Do not participate in activities where you could fall or become injured. Do not drive or use machinery. Do not drink alcohol. Do not take sleeping pills or medicines that cause drowsiness. Do not make important decisions or sign legal documents. Do not take care of children on your own. Eating and drinking Follow any instructions from your health care provider about eating or drinking restrictions. When you feel hungry, start by eating small amounts of foods that are soft and easy to digest (bland), such as toast. Gradually return to your regular diet. Drink enough fluid to keep your urine  pale yellow. If you vomit, rehydrate by drinking water, juice, or clear broth. General instructions If you have sleep apnea, surgery and certain medicines can increase your risk for breathing problems. Follow instructions from your health care provider about wearing your sleep device: Anytime you are sleeping, including during daytime naps. While taking prescription pain medicines, sleeping medicines, or medicines that make you drowsy. Have a responsible adult stay with you for the time you are told. It is important to have someone help care for you until you are awake and alert. Return to your normal activities as told by your health care provider. Ask your health care provider what activities are safe for you. Take over-the-counter and prescription medicines only as told by  your health care provider. If you smoke, do not smoke without supervision. Keep all follow-up visits as told by your health care provider. This is important. Contact a health care provider if: You have nausea or vomiting that does not get better with medicine. You cannot eat or drink without vomiting. You have pain that does not get better with medicine. You are unable to pass urine. You develop a skin rash. You have a fever. You have redness around your IV site that gets worse. Get help right away if: You have difficulty breathing. You have chest pain. You have blood in your urine or stool, or you vomit blood. Summary After the procedure, it is common to have a sore throat or nausea. It is also common to feel tired. Have a responsible adult stay with you for the time you are told. It is important to have someone help care for you until you are awake and alert. When you feel hungry, start by eating small amounts of foods that are soft and easy to digest (bland), such as toast. Gradually return to your regular diet. Drink enough fluid to keep your urine pale yellow. Return to your normal activities as told by your health care provider. Ask your health care provider what activities are safe for you. This information is not intended to replace advice given to you by your health care provider. Make sure you discuss any questions you have with your health care provider. Document Revised: 02/12/2020 Document Reviewed: 09/11/2019 Elsevier Patient Education  2022 [employer].

## 2021-04-25 ENCOUNTER — Encounter (HOSPITAL_COMMUNITY): Payer: Self-pay

## 2021-04-25 ENCOUNTER — Encounter (HOSPITAL_COMMUNITY)
Admission: RE | Admit: 2021-04-25 | Discharge: 2021-04-25 | Disposition: A | Discharge status: Home / Self Care | Payer: PPO | Source: Ambulatory Visit | Attending: Cardiology | Admitting: Cardiology

## 2021-04-25 DIAGNOSIS — I480 Paroxysmal atrial fibrillation: Secondary | ICD-10-CM

## 2021-04-25 HISTORY — DX: Other seasonal allergic rhinitis: J30.2

## 2021-04-25 HISTORY — DX: Gastro-esophageal reflux disease without esophagitis: K21.9

## 2021-04-25 HISTORY — DX: Anxiety disorder, unspecified: F41.9

## 2021-04-25 HISTORY — DX: Unspecified osteoarthritis, unspecified site: M19.90

## 2021-04-25 LAB — PROTIME-INR
INR: 1.3 — ABNORMAL HIGH (ref 0.8–1.2)
Prothrombin Time: 15.9 seconds — ABNORMAL HIGH (ref 11.4–15.2)

## 2021-04-27 ENCOUNTER — Other Ambulatory Visit: Payer: Self-pay

## 2021-04-27 ENCOUNTER — Ambulatory Visit (HOSPITAL_COMMUNITY)
Admission: RE | Admit: 2021-04-27 | Discharge: 2021-04-27 | Disposition: A | Discharge status: Home / Self Care | Payer: PPO | Attending: Cardiology | Admitting: Cardiology

## 2021-04-27 ENCOUNTER — Ambulatory Visit (HOSPITAL_COMMUNITY): Payer: PPO | Admitting: Certified Registered Nurse Anesthetist

## 2021-04-27 ENCOUNTER — Encounter (HOSPITAL_COMMUNITY)
Admission: RE | Disposition: A | Discharge status: Home / Self Care | Payer: Self-pay | Source: Home / Self Care | Attending: Cardiology

## 2021-04-27 ENCOUNTER — Encounter (HOSPITAL_COMMUNITY): Payer: Self-pay | Admitting: Cardiology

## 2021-04-27 DIAGNOSIS — Z86718 Personal history of other venous thrombosis and embolism: Secondary | ICD-10-CM | POA: Insufficient documentation

## 2021-04-27 DIAGNOSIS — I480 Paroxysmal atrial fibrillation: Secondary | ICD-10-CM

## 2021-04-27 DIAGNOSIS — Z79899 Other long term (current) drug therapy: Secondary | ICD-10-CM | POA: Diagnosis not present

## 2021-04-27 DIAGNOSIS — Z86711 Personal history of pulmonary embolism: Secondary | ICD-10-CM | POA: Diagnosis not present

## 2021-04-27 DIAGNOSIS — I4819 Other persistent atrial fibrillation: Secondary | ICD-10-CM | POA: Insufficient documentation

## 2021-04-27 DIAGNOSIS — K219 Gastro-esophageal reflux disease without esophagitis: Secondary | ICD-10-CM | POA: Diagnosis not present

## 2021-04-27 DIAGNOSIS — Z7901 Long term (current) use of anticoagulants: Secondary | ICD-10-CM | POA: Diagnosis not present

## 2021-04-27 DIAGNOSIS — I2510 Atherosclerotic heart disease of native coronary artery without angina pectoris: Secondary | ICD-10-CM | POA: Diagnosis not present

## 2021-04-27 DIAGNOSIS — R0902 Hypoxemia: Secondary | ICD-10-CM | POA: Insufficient documentation

## 2021-04-27 DIAGNOSIS — E782 Mixed hyperlipidemia: Secondary | ICD-10-CM | POA: Diagnosis not present

## 2021-04-27 DIAGNOSIS — F419 Anxiety disorder, unspecified: Secondary | ICD-10-CM | POA: Diagnosis not present

## 2021-04-27 HISTORY — PX: CARDIOVERSION: SHX1299

## 2021-04-27 SURGERY — CARDIOVERSION
Anesthesia: General

## 2021-04-27 MED ORDER — PROPOFOL 10 MG/ML IV BOLUS
INTRAVENOUS | Status: DC | PRN
  Administered 2021-04-27: 100 mg via INTRAVENOUS

## 2021-04-27 MED ORDER — LACTATED RINGERS IV SOLN: INTRAVENOUS | Status: DC | PRN

## 2021-04-27 NOTE — CV Procedure (Signed)
Elective direct-current cardioversion  Indication: Persistent atrial fibrillation  Description of procedure: Informed consent was obtained and the patient was taken to the PACU where a timeout was performed.  Anterior and posterior pads were placed in standard fashion and connected to a biphasic defibrillator.  Deep sedation was achieved via use of propofol per the anesthesia service.  Their records document dosing and monitoring details.  With sandbag on anterior chest pad, a single synchronized 150 J shock was delivered with restoration of sinus bradycardia, however he reverted to atrial fibrillation within 1 to 2 minutes.  Following this, a single synchronized 200 J shock was then delivered with patient remaining in atrial fibrillation.  He was hemodynamically stable during the procedure, although did have secretions requiring suctioning and hypoxemia with suspicion of increased likelihood of having obstructive sleep apnea.  Postprocedure ECG confirmed persistent atrial fibrillation.  Disposition: Situation discussed with the patient and his wife.  We will arrange follow-up with Dr. Harl Bowie for ongoing management.  I would not attempt follow-up cardioversion unless he is evaluated for obstructive sleep apnea and perhaps considered for initiation of antiarrhythmic therapy.  He does not indicate major symptoms with his atrial fibrillation however, and a strategy of heart rate control and anticoagulation could also be considered.  No changes made in present medications.  Satira Sark, M.D., F.A.C.C.

## 2021-04-27 NOTE — Progress Notes (Signed)
Electrical Cardioversion Procedure Note GEORG ANG [id number] [birth year]  Procedure: Electrical Cardioversion Indications:  Atrial Fibrillation  Procedure Details Consent: Risks of procedure as well as the alternatives and risks of each were explained to the (patient/caregiver).  Consent for procedure obtained. Time Out: Verified patient identification, verified procedure, site/side was marked, verified correct patient position, special equipment/implants available, medications/allergies/relevent history reviewed, required imaging and test results available.  Performed  Patient placed on cardiac monitor, pulse oximetry, supplemental oxygen as necessary.  Sedation given:  propofol Pacer pads placed anterior and posterior chest.  Cardioverted 2 time(s). 150J and 200J Cardioverted at unsuccessful.  Evaluation Findings: Post procedure EKG shows: Atrial Fibrillation Complications: None Patient did tolerate procedure well.   Kennith Maes 04/27/2021, 9:50 AM

## 2021-04-27 NOTE — Transfer of Care (Signed)
Immediate Anesthesia Transfer of Care Note  Patient: Brian Leach  Procedure(s) Performed: CARDIOVERSION  Patient Location: PACU  Anesthesia Type:General  Level of Consciousness: awake and alert   Airway & Oxygen Therapy: Patient Spontanous Breathing and Patient connected to nasal cannula oxygen  Post-op Assessment: Report given to RN and Post -op Vital signs reviewed and stable  Post vital signs: Reviewed and stable  Last Vitals:  Vitals Value Taken Time  BP 98/58   Temp 97.6   Pulse 72   Resp 25   SpO2 100%     Last Pain:  Vitals:   04/27/21 0826  TempSrc: Oral  PainSc: 0-No pain         Complications: No notable events documented.

## 2021-04-27 NOTE — Anesthesia Preprocedure Evaluation (Signed)
Anesthesia Evaluation  Patient identified by MRN, date of birth, ID band Patient awake    Reviewed: Allergy & Precautions, H&P , NPO status , Patient's Chart, lab work & pertinent test results, reviewed documented beta blocker date and time   History of Anesthesia Complications (+) DIFFICULT AIRWAY and history of anesthetic complications  Airway Mallampati: II  TM Distance: >3 FB Neck ROM: full    Dental no notable dental hx.    Pulmonary neg pulmonary ROS,    Pulmonary exam normal breath sounds clear to auscultation       Cardiovascular Exercise Tolerance: Good negative cardio ROS   Rhythm:irregular Rate:Normal     Neuro/Psych PSYCHIATRIC DISORDERS Anxiety negative neurological ROS     GI/Hepatic Neg liver ROS, GERD  Medicated,  Endo/Other  negative endocrine ROS  Renal/GU negative Renal ROS  negative genitourinary   Musculoskeletal   Abdominal   Peds  Hematology negative hematology ROS (+)   Anesthesia Other Findings 1. Left ventricular ejection fraction, by estimation, is 60 to 65%. The  left ventricle has normal function. The left ventricle has no regional  wall motion abnormalities. There is mild left ventricular hypertrophy.  Left ventricular diastolic parameters  are indeterminate.  2. Right ventricular systolic function was not well visualized. The right  ventricular size is not well visualized.  3. The mitral valve is normal in structure. No evidence of mitral valve  regurgitation. No evidence of mitral stenosis.  4. The aortic valve has an indeterminant number of cusps. Aortic valve  regurgitation is not visualized. No aortic stenosis is present.   Reproductive/Obstetrics negative OB ROS                             Anesthesia Physical Anesthesia Plan  ASA: 3  Anesthesia Plan: General   Post-op Pain Management:    Induction:   PONV Risk Score and Plan: Propofol  infusion  Airway Management Planned:   Additional Equipment:   Intra-op Plan:   Post-operative Plan:   Informed Consent: I have reviewed the patients History and Physical, chart, labs and discussed the procedure including the risks, benefits and alternatives for the proposed anesthesia with the patient or authorized representative who has indicated his/her understanding and acceptance.     Dental Advisory Given  Plan Discussed with: CRNA  Anesthesia Plan Comments:         Anesthesia Quick Evaluation

## 2021-04-27 NOTE — Interval H&P Note (Signed)
History and Physical Interval Note:  04/27/2021 8:37 AM  Brian Leach  has presented today for surgery, with the diagnosis of a-fib.  The various methods of treatment have been discussed with the patient and family. After consideration of risks, benefits and other options for treatment, the patient has consented to  Procedure(s): CARDIOVERSION (N/A) as a surgical intervention.  The patient's history has been reviewed, patient examined, no change in status, stable for surgery.  I have reviewed the patient's chart and labs.  Questions were answered to the patient's satisfaction.     Rozann Lesches

## 2021-04-27 NOTE — Anesthesia Postprocedure Evaluation (Signed)
Anesthesia Post Note  Patient: Brian Leach  Procedure(s) Performed: CARDIOVERSION  Patient location during evaluation: Phase II Anesthesia Type: General Level of consciousness: awake Pain management: pain level controlled Vital Signs Assessment: post-procedure vital signs reviewed and stable Respiratory status: spontaneous breathing and respiratory function stable Cardiovascular status: blood pressure returned to baseline and stable Postop Assessment: no headache and no apparent nausea or vomiting Anesthetic complications: no Comments: Late entry   No notable events documented.   Last Vitals:  Vitals:   04/27/21 1015 04/27/21 1025  BP: 107/75 113/70  Pulse: 73 70  Resp: 18 18  Temp:  36.7 C  SpO2: 95% 95%    Last Pain:  Vitals:   04/27/21 1025  TempSrc: Oral  PainSc: 0-No pain                 Louann Sjogren

## 2021-04-28 ENCOUNTER — Encounter (HOSPITAL_COMMUNITY): Payer: Self-pay | Admitting: Cardiology

## 2021-05-03 DIAGNOSIS — I4891 Unspecified atrial fibrillation: Secondary | ICD-10-CM | POA: Diagnosis not present

## 2021-05-03 DIAGNOSIS — F411 Generalized anxiety disorder: Secondary | ICD-10-CM | POA: Diagnosis not present

## 2021-05-03 DIAGNOSIS — M1711 Unilateral primary osteoarthritis, right knee: Secondary | ICD-10-CM | POA: Diagnosis not present

## 2021-05-03 DIAGNOSIS — M1712 Unilateral primary osteoarthritis, left knee: Secondary | ICD-10-CM | POA: Diagnosis not present

## 2021-05-03 DIAGNOSIS — I2510 Atherosclerotic heart disease of native coronary artery without angina pectoris: Secondary | ICD-10-CM | POA: Diagnosis not present

## 2021-05-03 DIAGNOSIS — R351 Nocturia: Secondary | ICD-10-CM | POA: Diagnosis not present

## 2021-05-03 DIAGNOSIS — N400 Enlarged prostate without lower urinary tract symptoms: Secondary | ICD-10-CM | POA: Diagnosis not present

## 2021-05-03 DIAGNOSIS — G4733 Obstructive sleep apnea (adult) (pediatric): Secondary | ICD-10-CM | POA: Diagnosis not present

## 2021-05-17 ENCOUNTER — Ambulatory Visit: Payer: PPO | Admitting: Internal Medicine

## 2021-05-24 ENCOUNTER — Ambulatory Visit: Payer: PPO | Admitting: Internal Medicine

## 2021-05-24 ENCOUNTER — Encounter: Payer: Self-pay | Admitting: Internal Medicine

## 2021-05-24 ENCOUNTER — Other Ambulatory Visit: Payer: Self-pay

## 2021-05-24 VITALS — BP 137/84 | HR 86 | Temp 97.5°F | Ht 67.0 in | Wt 245.0 lb

## 2021-05-24 DIAGNOSIS — Z86010 Personal history of colonic polyps: Secondary | ICD-10-CM

## 2021-05-24 NOTE — Patient Instructions (Signed)
It was good to see you again today!  As discussed you are not due to have a surveillance colonoscopy given your history of polyps.  You may have sleep apnea.  This needs to be sorted out and treated (if you have it) prior to undergoing a colonoscopy.  You will also have to hold your Eliquis for 3 days prior to the procedure.  As discussed, we will touch base with you 1 February and see where your sleep evaluation stands.  So long as we get your colonoscopy done in the next 2 to 3 months that will be fine.

## 2021-05-24 NOTE — Progress Notes (Signed)
Primary Care Physician:  Curlene Labrum, MD Primary Gastroenterologist:  Dr. Gala Romney  Pre-Procedure History & Physical: HPI:  Brian Leach is a 70 y.o. male here for consideration of surveillance colonoscopy.  History of multiple colonic adenomas/serrated polyps removed over time.  4 polyps removed from his colon 3 years ago.  He is due for surveillance at this time.  Recently he was diagnosed with DVT/PE for which he was started on Eliquis.  Recent failed cardioversion last month.  Concerns about sleep apnea during deep sedation.  He has been referred for a sleep study the first of the year.  He is not having any lower GI tract symptoms such as bleeding or change in bowel function. He tells me Eliquis is managed by Dr. [name].  Past Medical History:  Diagnosis Date   Anxiety    Arthritis    Complication of anesthesia    difficult intubation   GERD (gastroesophageal reflux disease)    History of gout    Seasonal allergies     Past Surgical History:  Procedure Laterality Date   CARDIOVERSION N/A 04/27/2021   Procedure: CARDIOVERSION;  Surgeon: Satira Sark, MD;  Location: [facility];  Service: Cardiovascular;  Laterality: N/A;   COLONOSCOPY N/A 04/07/2013   Procedure: COLONOSCOPY;  Surgeon: Daneil Dolin, MD;  Location: [facility];  Service: Endoscopy;  Laterality: N/A;  7:30 AM   COLONOSCOPY N/A 05/14/2018   Procedure: COLONOSCOPY;  Surgeon: Daneil Dolin, MD;  Location: [facility];  Service: Endoscopy;  Laterality: N/A;  10:30   KNEE ARTHROSCOPY Left    POLYPECTOMY  05/14/2018   Procedure: POLYPECTOMY;  Surgeon: Daneil Dolin, MD;  Location: [facility];  Service: Endoscopy;;  colon     Prior to Admission medications   Medication Sig Start Date End Date Taking? Authorizing Provider  acetaminophen (TYLENOL) 500 MG tablet Take 500-1,000 mg by mouth every 8 (eight) hours as needed for moderate pain.   Yes [provider]  apixaban (ELIQUIS) 5 MG TABS  tablet Take 5 mg by mouth 2 (two) times daily.   Yes [provider]  azelastine (ASTELIN) 0.1 % nasal spray Place 1 spray into both nostrils 2 (two) times daily as needed for rhinitis. Use in each nostril as directed   Yes [provider]  diltiazem (CARDIZEM CD) 240 MG 24 hr capsule Take 1 capsule (240 mg total) by mouth daily. 11/28/20  Yes Tat, Shanon Brow, MD  loratadine (CLARITIN) 10 MG tablet Take 10 mg by mouth daily as needed for allergies.   Yes [provider]  rosuvastatin (CRESTOR) 10 MG tablet Take 1 tablet (10 mg total) by mouth daily at 6 PM. 11/27/20  Yes Tat, Shanon Brow, MD  tamsulosin (FLOMAX) 0.4 MG CAPS capsule Take 0.4 mg by mouth daily. 03/16/21  Yes [provider]    Allergies as of 05/24/2021 - Review Complete 05/24/2021  Allergen Reaction Noted   Codeine Rash 02/25/2013    Family History  Problem Relation Age of Onset   Heart disease Brother     Social History   Socioeconomic History   Marital status: Single    Spouse name: Not on file   Number of children: Not on file   Years of education: Not on file   Highest education level: Not on file  Occupational History   Not on file  Tobacco Use   Smoking status: Never   Smokeless tobacco: Never  Vaping Use   Vaping  Use: Never used  Substance and Sexual Activity   Alcohol use: No   Drug use: No   Sexual activity: Yes    Birth control/protection: None  Other Topics Concern   Not on file  Social History Narrative   Not on file   Social Determinants of Health   Financial Resource Strain: Not on file  Food Insecurity: Not on file  Transportation Needs: Not on file  Physical Activity: Not on file  Stress: Not on file  Social Connections: Not on file  Intimate Partner Violence: Not on file    Review of Systems: See HPI, otherwise negative ROS  Physical Exam: BP 137/84    Pulse 86    Temp (!) 97.5 F (36.4 C) (Temporal)    Ht 5\' 7"  (1.702 m)    Wt 245 lb (111.1 kg)    BMI  38.37 kg/m  General:   Alert, obese, well-developed, well-nourished, pleasant and cooperative in NAD; accompanied by his wife. Neck:  Supple; no masses or thyromegaly. No significant cervical adenopathy. Lungs:  Clear throughout to auscultation.   No wheezes, crackles, or rhonchi. No acute distress. Heart: Irregularly irregular rhythm. Abdomen: Obese.  Positive bowel sounds soft and nontender without obvious mass organomegaly  pulses:  Normal pulses noted. Extremities:  Without clubbing or edema.  Impression/Plan: 70 year old gentleman with a history of multiple colonic polyps removed his colon over time. It is time for surveillance colonoscopy.  Recent new comorbidities include DVT/PE chronic anticoagulation.  Permanent atrial fibrillation.  The concern for sleep apnea was raised at his last sedation session.  Follow-up as planned.  Recommendations:  As discussed, surveillance colonoscopy due patient may patient may given your history of polyps.  Patient may have sleep apnea.  This needs to be sorted out and treated prior to undergoing a colonoscopy.  Eliquis will need to be held we will touch base in February 2023 for 3 days prior to the procedure.  We will plan to get him on the schedule sometime in early February 2023.  The risks, benefits, limitations, alternatives and imponderables have been reviewed with the patient. Questions have been answered. All parties are agreeable.       Notice: This dictation was prepared with Dragon dictation along with smaller phrase technology. Any transcriptional errors that result from this process are unintentional and may not be corrected upon review.

## 2021-05-30 DIAGNOSIS — R739 Hyperglycemia, unspecified: Secondary | ICD-10-CM | POA: Diagnosis not present

## 2021-05-30 DIAGNOSIS — E7801 Familial hypercholesterolemia: Secondary | ICD-10-CM | POA: Diagnosis not present

## 2021-05-30 DIAGNOSIS — R5383 Other fatigue: Secondary | ICD-10-CM | POA: Diagnosis not present

## 2021-05-30 DIAGNOSIS — D72829 Elevated white blood cell count, unspecified: Secondary | ICD-10-CM | POA: Diagnosis not present

## 2021-05-30 DIAGNOSIS — E7800 Pure hypercholesterolemia, unspecified: Secondary | ICD-10-CM | POA: Diagnosis not present

## 2021-06-02 DIAGNOSIS — N400 Enlarged prostate without lower urinary tract symptoms: Secondary | ICD-10-CM | POA: Diagnosis not present

## 2021-06-02 DIAGNOSIS — Z6838 Body mass index (BMI) 38.0-38.9, adult: Secondary | ICD-10-CM | POA: Diagnosis not present

## 2021-06-02 DIAGNOSIS — I4891 Unspecified atrial fibrillation: Secondary | ICD-10-CM | POA: Diagnosis not present

## 2021-06-02 DIAGNOSIS — I2510 Atherosclerotic heart disease of native coronary artery without angina pectoris: Secondary | ICD-10-CM | POA: Diagnosis not present

## 2021-06-02 DIAGNOSIS — M170 Bilateral primary osteoarthritis of knee: Secondary | ICD-10-CM | POA: Diagnosis not present

## 2021-06-02 DIAGNOSIS — R351 Nocturia: Secondary | ICD-10-CM | POA: Diagnosis not present

## 2021-06-02 DIAGNOSIS — G4733 Obstructive sleep apnea (adult) (pediatric): Secondary | ICD-10-CM | POA: Diagnosis not present

## 2021-06-02 DIAGNOSIS — F411 Generalized anxiety disorder: Secondary | ICD-10-CM | POA: Diagnosis not present

## 2021-06-07 ENCOUNTER — Ambulatory Visit: Payer: PPO | Admitting: Cardiology

## 2021-06-07 ENCOUNTER — Encounter: Payer: Self-pay | Admitting: Cardiology

## 2021-06-07 ENCOUNTER — Other Ambulatory Visit: Payer: Self-pay

## 2021-06-07 VITALS — BP 122/82 | HR 81 | Ht 67.0 in | Wt 244.2 lb

## 2021-06-07 DIAGNOSIS — Z86711 Personal history of pulmonary embolism: Secondary | ICD-10-CM

## 2021-06-07 DIAGNOSIS — I4891 Unspecified atrial fibrillation: Secondary | ICD-10-CM

## 2021-06-07 DIAGNOSIS — I480 Paroxysmal atrial fibrillation: Secondary | ICD-10-CM

## 2021-06-07 NOTE — Progress Notes (Signed)
Clinical Summary Brian Leach is a 70 y.o.male seen today for follow up of the following medical problems.   AFib -new diagnosis during 11/2020 admission with DVT/PE - self converted during that admission, started on diltiazem - recurrent afib as outpatient that was persistent, referred for DCCV - 04/2021 DCCV succesful but right back into afib minutes later  - no recent palpitations.   2.History of unprovoked DVT/PE - he is on eliquis    3. Hyperlipidemia - 11/2020 TC 186 TG 100 HDL 55 LDL 111 Past Medical History:  Diagnosis Date   Anxiety    Arthritis    Complication of anesthesia    difficult intubation   GERD (gastroesophageal reflux disease)    History of gout    Seasonal allergies      Allergies  Allergen Reactions   Codeine Rash     Current Outpatient Medications  Medication Sig Dispense Refill   acetaminophen (TYLENOL) 500 MG tablet Take 500-1,000 mg by mouth every 8 (eight) hours as needed for moderate pain.     apixaban (ELIQUIS) 5 MG TABS tablet Take 5 mg by mouth 2 (two) times daily.     azelastine (ASTELIN) 0.1 % nasal spray Place 1 spray into both nostrils 2 (two) times daily as needed for rhinitis. Use in each nostril as directed     diltiazem (CARDIZEM CD) 240 MG 24 hr capsule Take 1 capsule (240 mg total) by mouth daily. 30 capsule 1   loratadine (CLARITIN) 10 MG tablet Take 10 mg by mouth daily as needed for allergies.     rosuvastatin (CRESTOR) 10 MG tablet Take 1 tablet (10 mg total) by mouth daily at 6 PM. 30 tablet 1   tamsulosin (FLOMAX) 0.4 MG CAPS capsule Take 0.4 mg by mouth daily.     No current facility-administered medications for this visit.     Past Surgical History:  Procedure Laterality Date   CARDIOVERSION N/A 04/27/2021   Procedure: CARDIOVERSION;  Surgeon: Satira Sark, MD;  Location: [facility];  Service: Cardiovascular;  Laterality: N/A;   COLONOSCOPY N/A 04/07/2013   Procedure: COLONOSCOPY;  Surgeon: Daneil Dolin, MD;  Location: [facility];  Service: Endoscopy;  Laterality: N/A;  7:30 AM   COLONOSCOPY N/A 05/14/2018   Procedure: COLONOSCOPY;  Surgeon: Daneil Dolin, MD;  Location: [facility];  Service: Endoscopy;  Laterality: N/A;  10:30   KNEE ARTHROSCOPY Left    POLYPECTOMY  05/14/2018   Procedure: POLYPECTOMY;  Surgeon: Daneil Dolin, MD;  Location: [facility];  Service: Endoscopy;;  colon      Allergies  Allergen Reactions   Codeine Rash      Family History  Problem Relation Age of Onset   Heart disease Brother      Social History Brian Leach reports that he has never smoked. He has never used smokeless tobacco. Brian Leach reports no history of alcohol use.   Review of Systems CONSTITUTIONAL: No weight loss, fever, chills, weakness or fatigue.  HEENT: Eyes: No visual loss, blurred vision, double vision or yellow sclerae.No hearing loss, sneezing, congestion, runny nose or sore throat.  SKIN: No rash or itching.  CARDIOVASCULAR: per hpi RESPIRATORY: No shortness of breath, cough or sputum.  GASTROINTESTINAL: No anorexia, nausea, vomiting or diarrhea. No abdominal pain or blood.  GENITOURINARY: No burning on urination, no polyuria NEUROLOGICAL: No headache, dizziness, syncope, paralysis, ataxia, numbness or tingling in the extremities. No change in bowel or bladder control.  MUSCULOSKELETAL: No muscle, back pain, joint pain or stiffness.  LYMPHATICS: No enlarged nodes. No history of splenectomy.  PSYCHIATRIC: No history of depression or anxiety.  ENDOCRINOLOGIC: No reports of sweating, cold or heat intolerance. No polyuria or polydipsia.  Marland Kitchen   Physical Examination Today's Vitals   06/07/21 1459  BP: 122/82  Pulse: 81  SpO2: 96%  Weight: 244 lb 3.2 oz (110.8 kg)  Height: 5\' 7"  (1.702 m)   Body mass index is 38.25 kg/m.  Gen: resting comfortably, no acute distress HEENT: no scleral icterus, pupils equal round and reactive, no palptable cervical  adenopathy,  CV: irreg, no m/r/g no jvd Resp: Clear to auscultation bilaterally GI: abdomen is soft, non-tender, non-distended, normal bowel sounds, no hepatosplenomegaly MSK: extremities are warm, no edema.  Skin: warm, no rash Neuro:  no focal deficits Psych: appropriate affect   Diagnostic Studies  11/2020 echo IMPRESSIONS     1. Left ventricular ejection fraction, by estimation, is 60 to 65%. The  left ventricle has normal function. The left ventricle has no regional  wall motion abnormalities. There is mild left ventricular hypertrophy.  Left ventricular diastolic parameters  are indeterminate.   2. Right ventricular systolic function was not well visualized. The right  ventricular size is not well visualized.   3. The mitral valve is normal in structure. No evidence of mitral valve  regurgitation. No evidence of mitral stenosis.   4. The aortic valve has an indeterminant number of cusps. Aortic valve  regurgitation is not visualized. No aortic stenosis is present.    Assessment and Plan  1.Afib - failed DCCV, he is currently rate controlled and asymptomatic. Will continue rate control - continue eliquis - EKG today shows rate controlled afib  2. Unprovoked DVT/PE - continue eliquis      Arnoldo Lenis, M.D

## 2021-06-07 NOTE — Patient Instructions (Signed)
Medication Instructions:  Continue all current medications.   Labwork: none  Testing/Procedures: none  Follow-Up: 6 months   Any Other Special Instructions Will Be Listed Below (If Applicable).   If you need a refill on your cardiac medications before your next appointment, please call your pharmacy.  

## 2021-06-17 DIAGNOSIS — M79606 Pain in leg, unspecified: Secondary | ICD-10-CM | POA: Diagnosis not present

## 2021-06-17 DIAGNOSIS — M170 Bilateral primary osteoarthritis of knee: Secondary | ICD-10-CM | POA: Diagnosis not present

## 2021-06-22 DIAGNOSIS — M170 Bilateral primary osteoarthritis of knee: Secondary | ICD-10-CM | POA: Diagnosis not present

## 2021-06-22 DIAGNOSIS — M79606 Pain in leg, unspecified: Secondary | ICD-10-CM | POA: Diagnosis not present

## 2021-06-29 DIAGNOSIS — M170 Bilateral primary osteoarthritis of knee: Secondary | ICD-10-CM | POA: Diagnosis not present

## 2021-06-29 DIAGNOSIS — M79606 Pain in leg, unspecified: Secondary | ICD-10-CM | POA: Diagnosis not present

## 2021-07-05 ENCOUNTER — Other Ambulatory Visit: Payer: Self-pay

## 2021-07-05 ENCOUNTER — Ambulatory Visit: Payer: PPO | Admitting: Pulmonary Disease

## 2021-07-05 ENCOUNTER — Encounter: Payer: Self-pay | Admitting: Pulmonary Disease

## 2021-07-05 VITALS — BP 138/90 | HR 87 | Temp 98.0°F | Ht 67.5 in | Wt 233.6 lb

## 2021-07-05 DIAGNOSIS — N3944 Nocturnal enuresis: Secondary | ICD-10-CM

## 2021-07-05 DIAGNOSIS — R0683 Snoring: Secondary | ICD-10-CM

## 2021-07-05 NOTE — Progress Notes (Signed)
[facility]  Chief Complaint  Patient presents with   Consult    Patient referred for sleep apnea consult for snoring    Past Surgical History:  He  has a past surgical history that includes Knee arthroscopy (Left); Colonoscopy (N/A, 04/07/2013); Colonoscopy (N/A, 05/14/2018); polypectomy (05/14/2018); and Cardioversion (N/A, 04/27/2021).  Past Medical History:  Anxiety, OA, GERD, Gout, Allergies, PE with Lt leg DVT June 2022, PAF, BPH, HLD  Constitutional:  BP 138/90 (BP Location: Left Arm, Patient Position: Sitting)    Pulse 87    Temp 98 F (36.7 C) (Temporal)    Ht 5' 7.5" (1.715 m)    Wt 233 lb 9.6 oz (106 kg)    SpO2 98% Comment: ra   BMI 36.05 kg/m   Brief Summary:  Brian Leach is a 71 y.o. male with snoring.      Subjective:   He is here with his wife.  He was recently diagnosed with atrial fibrillation and pre diabetes.  He had a procedure that required conscious sedation.  After the procedure the anesthesiologist informed him he should have assessment for sleep apnea.  He has to sleep in a recliner.  He wakes up frequently to use the bathroom.  This happens more often is he sleeps laying flat.  His wife says he snores and makes funny noises with his breathing.  He can fall asleep while watching TV.  He has been feeling more anxious recently and was started on zoloft.  He goes to bed at 10 pm and falls asleep in about 15 minutes.  He gets out of bed at 730 am.  He wakes up ever 30 to 45 minutes to use the bathroom  He denies morning headache.  He does not use anything to help him fall sleep or stay awake.  He denies sleep walking, sleep talking, bruxism, or nightmares.  There is no history of restless legs.  He denies sleep hallucinations, sleep paralysis, or cataplexy.  The Epworth score is 3 out of 24.   Physical Exam:   Appearance - well kempt   ENMT - no sinus tenderness, no oral exudate, no LAN, Mallampati 4  airway, no stridor  Respiratory - equal breath sounds bilaterally, no wheezing or rales  CV - s1s2 regular rate and rhythm, no murmurs  Ext - no clubbing, no edema  Skin - no rashes  Psych - normal mood and affect   Chest Imaging:  CT angio chest 11/24/20 >> b/l PE, atherosclerosis  Sleep Tests:    Cardiac Tests:  Doppler legs b/l 11/24/20 >> DVT Lt poplietal and distal femoral veins Echo 11/25/20 >> EF 60 to 65%, mild LVH  Social History:  He  reports that he has never smoked. He has never used smokeless tobacco. He reports that he does not drink alcohol and does not use drugs.  Family History:  His family history includes Heart disease in his brother.    Discussion:  He has snoring, sleep disruption, apnea, daytime sleepiness, and enuresis.  He has history of hypertension, atrial fibrillation, and anxiety.  His BMI is > 35.  I am concerned he could have obstructive sleep apnea.  Assessment/Plan:   Snoring with excessive daytime sleepiness. - he is concerned that due to frequent bathroom trips at night a home sleep study wouldn't be able to accurately determine when he is asleep and when he is awake during the test - as such will need to arrange for  a in lab sleep study  Nocturnal enuresis. - explained how untreated sleep apnea can impact this  Paroxysmal atrial fibrillation, Unprovoked Pulmonary embolism. - followed by Dr. Carlyle Dolly with [facility]  Obesity. - discussed how weight can impact sleep and risk for sleep disordered breathing - discussed options to assist with weight loss: combination of diet modification, cardiovascular and strength training exercises  Cardiovascular risk. - had an extensive discussion regarding the adverse health consequences related to untreated sleep disordered breathing - specifically discussed the risks for hypertension, coronary artery disease, cardiac dysrhythmias, cerebrovascular disease, and diabetes - lifestyle  modification discussed  Safe driving practices. - discussed how sleep disruption can increase risk of accidents, particularly when driving - safe driving practices were discussed  Therapies for obstructive sleep apnea. - if the sleep study shows significant sleep apnea, then various therapies for treatment were reviewed: CPAP, oral appliance, and surgical interventions  Time Spent Involved in Patient Care on Day of Examination:  51 minutes  Follow up:   Patient Instructions  Will arrange for an in lab sleep study Will call to arrange for follow up after sleep study reviewed   Medication List:   Allergies as of 07/05/2021       Reactions   Codeine Rash        Medication List        Accurate as of July 05, 2021 12:19 PM. If you have any questions, ask your nurse or doctor.          acetaminophen 500 MG tablet Commonly known as: TYLENOL Take 500-1,000 mg by mouth every 8 (eight) hours as needed for moderate pain.   apixaban 5 MG Tabs tablet Commonly known as: ELIQUIS Take 5 mg by mouth 2 (two) times daily.   azelastine 0.1 % nasal spray Commonly known as: ASTELIN Place 1 spray into both nostrils 2 (two) times daily as needed for rhinitis. Use in each nostril as directed   diltiazem 240 MG 24 hr capsule Commonly known as: CARDIZEM CD Take 1 capsule (240 mg total) by mouth daily.   finasteride 5 MG tablet Commonly known as: PROSCAR Take 5 mg by mouth daily.   loratadine 10 MG tablet Commonly known as: CLARITIN Take 10 mg by mouth daily as needed for allergies.   rosuvastatin 10 MG tablet Commonly known as: CRESTOR Take 1 tablet (10 mg total) by mouth daily at 6 PM.   sertraline 25 MG tablet Commonly known as: ZOLOFT Take 25 mg by mouth daily.   tamsulosin 0.4 MG Caps capsule Commonly known as: FLOMAX Take 0.4 mg by mouth daily.        Signature:  Chesley Mires, MD [facility] Pager - [phone] 07/05/2021, 12:19  PM

## 2021-07-05 NOTE — Patient Instructions (Signed)
Will arrange for an in lab sleep study Will call to arrange for follow up after sleep study reviewed

## 2021-07-06 DIAGNOSIS — M170 Bilateral primary osteoarthritis of knee: Secondary | ICD-10-CM | POA: Diagnosis not present

## 2021-07-06 DIAGNOSIS — M79606 Pain in leg, unspecified: Secondary | ICD-10-CM | POA: Diagnosis not present

## 2021-07-13 DIAGNOSIS — M79606 Pain in leg, unspecified: Secondary | ICD-10-CM | POA: Diagnosis not present

## 2021-07-13 DIAGNOSIS — M170 Bilateral primary osteoarthritis of knee: Secondary | ICD-10-CM | POA: Diagnosis not present

## 2021-07-18 DIAGNOSIS — M79606 Pain in leg, unspecified: Secondary | ICD-10-CM | POA: Diagnosis not present

## 2021-07-18 DIAGNOSIS — M170 Bilateral primary osteoarthritis of knee: Secondary | ICD-10-CM | POA: Diagnosis not present

## 2021-07-27 DIAGNOSIS — M79606 Pain in leg, unspecified: Secondary | ICD-10-CM | POA: Diagnosis not present

## 2021-07-27 DIAGNOSIS — M170 Bilateral primary osteoarthritis of knee: Secondary | ICD-10-CM | POA: Diagnosis not present

## 2021-07-29 ENCOUNTER — Encounter: Payer: PPO | Admitting: Neurology

## 2021-08-03 ENCOUNTER — Telehealth: Payer: Self-pay | Admitting: Internal Medicine

## 2021-08-03 DIAGNOSIS — M170 Bilateral primary osteoarthritis of knee: Secondary | ICD-10-CM | POA: Diagnosis not present

## 2021-08-03 DIAGNOSIS — M79606 Pain in leg, unspecified: Secondary | ICD-10-CM | POA: Diagnosis not present

## 2021-08-03 NOTE — Telephone Encounter (Signed)
Per December OV: "You may have sleep apnea.  This needs to be sorted out and treated (if you have it) prior to undergoing a colonoscopy.   You will also have to hold your Eliquis for 3 days prior to the procedure.   As discussed, we will touch base with you 1 February and see where your sleep evaluation stands."  Called pt, LMOVM. He will need to have sleep test done prior per recs.

## 2021-08-03 NOTE — Telephone Encounter (Signed)
Pt was seen by Dr Gala Romney on 05/24/2021 and was told he would be scheduled a colonoscopy in early February. He was calling to get scheduled. He said we were going to get with his heart doctor and he was also having a sleep test for sleep apnea done on 08/14/2021. Please advise. [phone]

## 2021-08-03 NOTE — Telephone Encounter (Signed)
Pt made aware

## 2021-08-14 ENCOUNTER — Ambulatory Visit: Payer: PPO | Attending: Pulmonary Disease | Admitting: Pulmonary Disease

## 2021-08-14 ENCOUNTER — Other Ambulatory Visit: Payer: Self-pay

## 2021-08-14 DIAGNOSIS — G4733 Obstructive sleep apnea (adult) (pediatric): Secondary | ICD-10-CM | POA: Diagnosis not present

## 2021-08-14 DIAGNOSIS — I4891 Unspecified atrial fibrillation: Secondary | ICD-10-CM | POA: Insufficient documentation

## 2021-08-14 DIAGNOSIS — R0683 Snoring: Secondary | ICD-10-CM | POA: Diagnosis not present

## 2021-08-14 DIAGNOSIS — N3944 Nocturnal enuresis: Secondary | ICD-10-CM | POA: Diagnosis not present

## 2021-08-23 ENCOUNTER — Telehealth: Payer: Self-pay | Admitting: Internal Medicine

## 2021-08-23 NOTE — Telephone Encounter (Signed)
Pt called to see if we got his sleep study results yet so we can schedule his colonoscopy that is past due. Please advise. [phone] ?

## 2021-08-23 NOTE — Telephone Encounter (Signed)
Informed pt that the sleep study results are not yet available in Epic. Pt will call once results have been formally given to him.  ?

## 2021-08-25 ENCOUNTER — Telehealth: Payer: Self-pay | Admitting: Internal Medicine

## 2021-08-25 ENCOUNTER — Telehealth: Payer: Self-pay | Admitting: Pulmonary Disease

## 2021-08-25 DIAGNOSIS — R0683 Snoring: Secondary | ICD-10-CM | POA: Diagnosis not present

## 2021-08-25 NOTE — Telephone Encounter (Signed)
Patient called and said that his sleep was complete  ?

## 2021-08-25 NOTE — Procedures (Signed)
? ? ?  Patient Name: Brian Leach, Brian Leach ?Study Date: 08/14/2021 ?Gender: Male ?D.O.B: Jul 30, 1950 ?Age (years): 75 ?Referring Provider: Chesley Mires MD, ABSM ?Height (inches): 69 ?Interpreting Physician: Chesley Mires MD, ABSM ?Weight (lbs): 233 ?RPSGT: Rosebud Poles ?BMI: 35 ?MRN: [id number] ?Neck Size: 19.50 ? ?CLINICAL INFORMATION ?Sleep Study Type: NPSG ? ?Indication for sleep study: Snoring, sleep disruption, and daytime sleepiness. ? ?Epworth Sleepiness Score: 2 ? ?SLEEP STUDY TECHNIQUE ?As per the AASM Manual for the Scoring of Sleep and Associated Events v2.3 (April 2016) with a hypopnea requiring 4% desaturations. ? ?The channels recorded and monitored were frontal, central and occipital EEG, electrooculogram (EOG), submentalis EMG (chin), nasal and oral airflow, thoracic and abdominal wall motion, anterior tibialis EMG, snore microphone, electrocardiogram, and pulse oximetry. ? ?MEDICATIONS ?Medications self-administered by patient taken the night of the study : N/A ? ?SLEEP ARCHITECTURE ?The study was initiated at 10:42:38 PM and ended at 5:10:30 AM. ? ?Sleep onset time was 37.8 minutes and the sleep efficiency was 61.9%. The total sleep time was 240.1 minutes. ? ?Stage REM latency was 216.5 minutes. ? ?The patient spent 15.41% of the night in stage N1 sleep, 52.52% in stage N2 sleep, 10.00% in stage N3 and 22.1% in REM. ? ?Alpha intrusion was absent. ? ?Supine sleep was 72.10%. ? ?RESPIRATORY PARAMETERS ?The overall apnea/hypopnea index (AHI) was 9.7 per hour. There were 2 total apneas, including 2 obstructive, 0 central and 0 mixed apneas. There were 37 hypopneas and 0 RERAs. ? ?The AHI during Stage REM sleep was 5.7 per hour. ? ?AHI while supine was 11.8 per hour. ? ?The mean oxygen saturation was 94.69%. The minimum SpO2 during sleep was 90.00%. ? ?Moderate snoring was noted during this study. ? ?CARDIAC DATA ?The 2 lead EKG demonstrated sinus rhythm. The mean heart rate was 77.23 beats per minute. Other  EKG findings include: Atrial Fibrillation. ? ?LEG MOVEMENT DATA ?The total PLMS were 373 with a resulting PLMS index of 93.21. Associated arousal with leg movement index was 9.0 . ? ?IMPRESSIONS ?- Mild obstructive sleep apnea with an AHI of 9.7 and SpO2 low of 90%. ?- The patient snored with moderate snoring volume. ?- EKG findings include Atrial Fibrillation.  He has history of atrial fibrillation. ? ?DIAGNOSIS ?- Obstructive Sleep Apnea (G47.33) ? ?RECOMMENDATIONS ?- Additional therapies include weight loss, CPAP, oral appliance, or surgical assessment. ?- Avoid alcohol, sedatives and other CNS depressants that may worsen sleep apnea and disrupt normal sleep architecture. ?- Sleep hygiene should be reviewed to assess factors that may improve sleep quality. ? ?[Electronically signed] 08/25/2021 01:18 PM ? ?Chesley Mires MD, ABSM ?Diplomate, [employer] of Sleep Medicine ?NPI: [id number] ? ?[facility] ?PH: [phone]   FX: [phone] ?ACCREDITED BY THE AMERICAN ACADEMY OF SLEEP MEDICINE ? ?

## 2021-08-25 NOTE — Telephone Encounter (Signed)
PSG 08/14/21 >> AHI 9.7, SpO2 low 90%. ? ? ?Please inform him that his sleep study shows mild obstructive sleep apnea.  Please arrange for ROV with me or NP to discuss treatment options. ? ? ? ?

## 2021-08-25 NOTE — Telephone Encounter (Signed)
Pt called stating that his sleep study results are available in [place]. Pt is was instructed to call us once he had it completed.  ?

## 2021-08-25 NOTE — Telephone Encounter (Signed)
Called and went over results with patient. He voiced understanding and had already had appt scheduled by someone else with [facility] to f/u on HST results and treatment options. Nothing further needed.  ?

## 2021-08-26 ENCOUNTER — Encounter: Payer: Self-pay | Admitting: Acute Care

## 2021-08-26 ENCOUNTER — Other Ambulatory Visit: Payer: Self-pay

## 2021-08-26 ENCOUNTER — Ambulatory Visit: Payer: PPO | Admitting: Acute Care

## 2021-08-26 VITALS — BP 102/60 | HR 79 | Temp 98.1°F | Ht 65.0 in | Wt 226.0 lb

## 2021-08-26 DIAGNOSIS — G4733 Obstructive sleep apnea (adult) (pediatric): Secondary | ICD-10-CM | POA: Diagnosis not present

## 2021-08-26 DIAGNOSIS — I2699 Other pulmonary embolism without acute cor pulmonale: Secondary | ICD-10-CM

## 2021-08-26 DIAGNOSIS — I4819 Other persistent atrial fibrillation: Secondary | ICD-10-CM | POA: Diagnosis not present

## 2021-08-26 NOTE — Patient Instructions (Addendum)
It is good to see you today. ?You have mild sleep apnea. ?We will place orders for a CPAP machine  ?We will start CPAP 5-15 cm H2O.  ?Pt. Would like nasal prongs dream ware.  ?Please place orders for supplies and equipment. ?Will need follow up with down Load within 90 days after initiation of treatment.  ?Continue on CPAP at bedtime. You appear to be benefiting from the treatment  ?Goal is to wear for at least 6 hours each night for maximal clinical benefit. ?Continue to work on weight loss, as the link between excess weight  and sleep apnea is well established.   ?Remember to establish a good bedtime routine, and work on sleep hygiene.  ?Limit daytime naps , avoid stimulants such as caffeine and nicotine close to bedtime, exercise daily to promote sleep quality, avoid heavy , spicy, fried , or rich foods before bed. Ensure adequate exposure to natural light during the day,establish a relaxing bedtime routine with a pleasant sleep environment ( Bedroom between 60 and 67 degrees, turn off bright lights , TV or device screens screens , consider black out curtains or white noise machines) ?Do not drive if sleepy. ?Remember to clean mask, tubing, filter, and reservoir once weekly with soapy water.  ?Follow up with Judson Roch NP or Dr. Halford Chessman  within 90 days  or before as needed.    ?Please contact office for sooner follow up if symptoms do not improve or worsen or seek emergency care   ? ?

## 2021-08-26 NOTE — Progress Notes (Signed)
? ?History of Present Illness ?Brian Leach is a 71 y.o. male never smoker with snoring, Anxiety, OA, GERD, Gout, Allergies, PE with Lt leg DVT June 2022, PAF, BPH, HLD and DM II . He was referred to Dr. Halford Chessman for a sleep consult as he snores and sleep apnea needed to be ruled out. He had witnessed apnea during a cardio version for his atrial fibrillation. .  ? ? ?08/26/2021 ?Pt. Presents for follow up after sleep study. Sleep study was diagnostic for Mild obstructive sleep apnea with an AHI of 9.7 and SpO2 low of 90%. We discussed these findings, and that with his history of atrial fibrillation and DM, he needs to treat his apnea. He has a significant family history of heart disease. We also discussed that the risks for hypertension, coronary artery disease, cardiac dysrhythmias, cerebrovascular disease, and diabetes are all associated with untreated sleep apnea. We discussed CPAP therapy. He is in agreement with CPAP therapy as long as he can use nasal pillows. He does not think he could tolerate a mask. He asked about the [facility] device, and I explained that he would need to lose weight before that was an option  for him. We discussed weight loss in general , and he is working on this with his new diagnosis of DM . He understands if he loses a significant amount of weight , we can repeat the sleep study to determine if he needs CPAP and more, and if he does, we can consider Inspire at that time if his BMI is within range.  ? ?He does endorse frequent night time awakening due to the need to go to the bathroom. He endorses  daytime sleepiness.  ? ?Test Results: ? ?PSG 08/14/21 >> AHI 9.7, SpO2 low 90%. ?Chest Imaging:  ?CT angio chest 11/24/20 >> b/l PE, atherosclerosis ?  ?Sleep Tests:  ?  ?  ?Cardiac Tests:  ?Doppler legs b/l 11/24/20 >> DVT Lt poplietal and distal femoral veins ?Echo 11/25/20 >> EF 60 to 65%, mild LVH ?  ?  ?Past Surgical History:  ?He  has a past surgical history that includes Knee arthroscopy  (Left); Colonoscopy (N/A, 04/07/2013); Colonoscopy (N/A, 05/14/2018); polypectomy (05/14/2018); and Cardioversion (N/A, 04/27/2021). ?  ?Past Medical History:  ?Anxiety, OA, GERD, Gout, Allergies, PE with Lt leg DVT June 2022, PAF, BPH, HLD ?  ?  ?CBC Latest Ref Rng & Units 11/27/2020 11/26/2020 11/25/2020  ?WBC 4.0 - 10.5 K/uL 10.2 10.3 10.0  ?Hemoglobin 13.0 - 17.0 g/dL 17.3(H) 15.9 16.7  ?Hematocrit 39.0 - 52.0 % 52.4(H) 48.6 50.4  ?Platelets 150 - 400 K/uL 233 209 195  ? ? ?BMP Latest Ref Rng & Units 02/09/2021 11/26/2020 11/25/2020  ?Glucose 65 - 139 mg/dL 141(H) 126(H) 115(H)  ?BUN 7 - 25 mg/dL '21 16 15  '$ ?Creatinine 0.70 - 1.35 mg/dL 1.01 0.95 0.77  ?BUN/Creat Ratio 6 - 22 (calc) NOT APPLICABLE - -  ?Sodium 135 - 146 mmol/L 136 134(L) 135  ?Potassium 3.5 - 5.3 mmol/L 4.4 4.0 3.9  ?Chloride 98 - 110 mmol/L 105 104 103  ?CO2 20 - 32 mmol/L '23 22 22  '$ ?Calcium 8.6 - 10.3 mg/dL 9.1 8.3(L) 8.4(L)  ? ? ?BNP ?   ?Component Value Date/Time  ? BNP 61.0 11/24/2020 1612  ? ? ?ProBNP ?No results found for: PROBNP ? ?PFT ?No results found for: FEV1PRE, FEV1POST, FVCPRE, FVCPOST, TLC, DLCOUNC, PREFEV1FVCRT, PSTFEV1FVCRT ? ?SLEEP STUDY DOCUMENTS ? ?Result Date: 08/23/2021 ?Ordered by an unspecified provider.  ? ? ?  Past medical hx ?Past Medical History:  ?Diagnosis Date  ? Anxiety   ? Arthritis   ? BPH (benign prostatic hyperplasia)   ? Complication of anesthesia   ? difficult intubation  ? GERD (gastroesophageal reflux disease)   ? History of gout   ? Hyperlipidemia   ? Left leg DVT ([place]) 11/2020  ? Paroxysmal atrial fibrillation (HCC)   ? Pre-diabetes   ? Pulmonary embolism ([place]) 11/2020  ? Seasonal allergies   ?  ? ?Social History  ? ?Tobacco Use  ? Smoking status: Never  ? Smokeless tobacco: Never  ?Vaping Use  ? Vaping Use: Never used  ?Substance Use Topics  ? Alcohol use: No  ? Drug use: No  ? ? ?Brian Leach reports that he has never smoked. He has never used smokeless tobacco. He reports that he does not drink alcohol and does  not use drugs. ? ?Tobacco Cessation: ?Never smoker  ? ? ?Past surgical hx, Family hx, Social hx all reviewed. ? ?Current Outpatient Medications on File Prior to Visit  ?Medication Sig  ? acetaminophen (TYLENOL) 500 MG tablet Take 500-1,000 mg by mouth every 8 (eight) hours as needed for moderate pain.  ? apixaban (ELIQUIS) 5 MG TABS tablet Take 5 mg by mouth 2 (two) times daily.  ? azelastine (ASTELIN) 0.1 % nasal spray Place 1 spray into both nostrils 2 (two) times daily as needed for rhinitis. Use in each nostril as directed  ? diltiazem (CARDIZEM CD) 240 MG 24 hr capsule Take 1 capsule (240 mg total) by mouth daily.  ? finasteride (PROSCAR) 5 MG tablet Take 5 mg by mouth daily.  ? loratadine (CLARITIN) 10 MG tablet Take 10 mg by mouth daily as needed for allergies.  ? rosuvastatin (CRESTOR) 10 MG tablet Take 1 tablet (10 mg total) by mouth daily at 6 PM.  ? sertraline (ZOLOFT) 25 MG tablet Take 25 mg by mouth daily.  ? tamsulosin (FLOMAX) 0.4 MG CAPS capsule Take 0.4 mg by mouth daily.  ? ?No current facility-administered medications on file prior to visit.  ?  ? ?Allergies  ?Allergen Reactions  ? Codeine Rash  ? ? ?Review Of Systems: ? ?Constitutional:   No  weight loss, night sweats,  Fevers, chills, + fatigue, or  lassitude. ? ?HEENT:   No headaches,  Difficulty swallowing,  Tooth/dental problems, or  Sore throat,  ?              No sneezing, itching, ear ache, nasal congestion, post nasal drip,  ? ?CV:  No chest pain,  Orthopnea, PND, swelling in lower extremities, anasarca, dizziness, palpitations, syncope.  ? ?GI  No heartburn, indigestion, abdominal pain, nausea, vomiting, diarrhea, change in bowel habits, loss of appetite, bloody stools.  ? ?Resp: No shortness of breath with exertion or at rest.  No excess mucus, no productive cough,  No non-productive cough,  No coughing up of blood.  No change in color of mucus.  No wheezing.  No chest wall deformity ? ?Skin: no rash or lesions. ? ?GU: no dysuria,  change in color of urine, no urgency or frequency.  No flank pain, no hematuria  ? ?MS:  No joint pain or swelling.  No decreased range of motion.  No back pain. ? ?Psych:  No change in mood or affect. No depression or anxiety.  No memory loss. ? ?Sleep: Frequent night time awakenings to void. Daytime fatigue ? ? ?Vital Signs ?BP 102/60 (BP Location: Left Arm, Patient Position: Sitting, Cuff  Size: Large)   Pulse 79   Temp 98.1 ?F (36.7 ?C) (Oral)   Ht '5\' 5"'$  (1.651 m)   Wt 226 lb (102.5 kg)   SpO2 98%   BMI 37.61 kg/m?  ? ? ?Physical Exam: ? ?General- No distress,  A&Ox3, pleasant  ?ENT: No sinus tenderness, TM clear, pale nasal mucosa, no oral exudate,no post nasal drip, no LAN ?Cardiac: S1, S2, regular rate and rhythm, no murmur ?Chest: No wheeze/ rales/ dullness; no accessory muscle use, no nasal flaring, no sternal retractions, diminished per bases bilaterally ?Abd.: Soft Non-tender, ND, BS +, Body mass index is 37.61 kg/m?.  ?Ext: No clubbing cyanosis, edema ?Neuro:  normal strength, MAE x 4, A&O x 3 ?Skin: No rashes, warm and dry, No lesions  ?Psych: normal mood and behavior ? ? ?Assessment/Plan ? ?Mild Obstructive Sleep Apnea  ?PAF on Eliquis ?New diagnosis DM ?Hx. Of PE ?Plan ?You have mild sleep apnea. ?We will place orders for a CPAP machine  ?We will start CPAP 5-15 cm H2O.  ?Pt. Would like nasal prongs dream ware.  ?Please place orders for supplies and equipment. ?Will need follow up with down Load within 90 days after initiation of treatment.  ?Continue on CPAP at bedtime. You appear to be benefiting from the treatment  ?Goal is to wear for at least 6 hours each night for maximal clinical benefit. ?Continue to work on weight loss, as the link between excess weight  and sleep apnea is well established. ?Avoid alcohol, sedatives and other CNS depressants that may worsen sleep apnea and disrupt normal sleep architecture.  ?Remember to establish a good bedtime routine, and work on sleep hygiene.   ?Limit daytime naps , avoid stimulants such as caffeine and nicotine close to bedtime, exercise daily to promote sleep quality, avoid heavy , spicy, fried , or rich foods before bed. Ensure adequate exposure to natural l

## 2021-08-26 NOTE — Telephone Encounter (Signed)
Phoned and LMOVM for the pt to return the call. 

## 2021-08-29 ENCOUNTER — Encounter: Payer: Self-pay | Admitting: Internal Medicine

## 2021-08-29 ENCOUNTER — Telehealth: Payer: Self-pay

## 2021-08-29 DIAGNOSIS — R739 Hyperglycemia, unspecified: Secondary | ICD-10-CM | POA: Diagnosis not present

## 2021-08-29 DIAGNOSIS — E7801 Familial hypercholesterolemia: Secondary | ICD-10-CM | POA: Diagnosis not present

## 2021-08-29 DIAGNOSIS — Z1329 Encounter for screening for other suspected endocrine disorder: Secondary | ICD-10-CM | POA: Diagnosis not present

## 2021-08-29 DIAGNOSIS — E7800 Pure hypercholesterolemia, unspecified: Secondary | ICD-10-CM | POA: Diagnosis not present

## 2021-08-29 DIAGNOSIS — R5383 Other fatigue: Secondary | ICD-10-CM | POA: Diagnosis not present

## 2021-08-29 NOTE — Telephone Encounter (Signed)
Patient with diagnosis of DVT/PE and afib on Eliquis for anticoagulation.   ? ?Procedure: colonoscopy ?Date of procedure: TBD ? ?Patient with unprovoked DVT/PE in June 2022 ?He was negative for lupus anticoagulant disorder, factor V Leiden deficiency, and prothrombin gene mutation  ? ?CHA2DS2-VASc Score = 2  ? This indicates a 2.2% annual risk of stroke. ?The patient's score is based upon: ?CHF History: 0 ?HTN History: 0 ?Diabetes History: 0 ?Stroke History: 0 ?Vascular Disease History: 1 ?Age Score: 1 ?Gender Score: 0 ?  ?  ? ?CrCl 76 ml/min ? ?Per office protocol, patient can hold Eliquis for 2 days prior to procedure. Patient should ideally resume Eliquis 24 hours post procedure. ? ? ?

## 2021-08-29 NOTE — Progress Notes (Signed)
Reviewed and agree with assessment/plan. ? ? ?Keanu Frickey, MD ?[facility] ?08/29/2021, 8:31 AM ?Pager:  [phone] ? ?

## 2021-08-29 NOTE — Telephone Encounter (Signed)
OV made for 09/13/2021 at 3pm with Dr Gala Romney and appt letter mailed.  ?

## 2021-08-29 NOTE — Telephone Encounter (Signed)
Spoke with pt and his is wanting to know why there is no rush on getting the colonoscopy scheduled now. Pt states that at his last ov you seemed concerned about the time frame on which the colonoscopy needed to be done and stated on the avs "so long as it is done in the next 2 to 3 mths you would be fine" he is wanting to know why now there is no rush. Pt also stated that he is waiting on his cpap machine to be delivered that the lady stated that they have been running 1 to 2 mths behind on being delivered. Pt also stated that he was told that his sleep apnea was mild. Please advise.  ?

## 2021-08-29 NOTE — Telephone Encounter (Signed)
Pt was made aware and verbalized understanding. Pt states that Dr. Harl Bowie is over his Eliquis. Will send a message to Dr. Harl Bowie getting ok to hold Eliquis x 2 days. Routing to front to get office visit as soon as possible to schedule colonoscopy.  ?

## 2021-08-29 NOTE — Telephone Encounter (Signed)
Attention: Preop ? ? ?We would like to request holding the following medication for patient please. ? ?Procedure: Colonoscopy ? ?Date: TBD ? ?Medication to hold: Eliquis x 2 days ? ?Surgeon: Dr. Gala Romney ? ?Phone: [phone] ? ?Fax:  [phone] ? ?Type of Anesthesia: Propofol ? ? ? ? ? ? ?  ?

## 2021-08-30 NOTE — Telephone Encounter (Signed)
See correspondence below in regards to holding Eliquis. Pt is on schedule to see you 09/13/21 for scheduling and follow up.  ?

## 2021-08-30 NOTE — Telephone Encounter (Signed)
? ?  Name: Brian Leach  ?DOB: 06-29-1950  ?MRN: [id number]  ? ?Primary Cardiologist: Carlyle Dolly, MD ? ?Chart reviewed as part of pre-operative protocol coverage. We have been asked for guidance to hold anticoagulation for colonoscopy.  ? ?Per our clinical pharmacist: ?Patient with diagnosis of DVT/PE and afib on Eliquis for anticoagulation.   ?  ?Procedure: colonoscopy ?Date of procedure: TBD ?  ?Patient with unprovoked DVT/PE in June 2022 ?He was negative for lupus anticoagulant disorder, factor V Leiden deficiency, and prothrombin gene mutation  ?  ?CHA2DS2-VASc Score = 2  ? This indicates a 2.2% annual risk of stroke. ?The patient's score is based upon: ?CHF History: 0 ?HTN History: 0 ?Diabetes History: 0 ?Stroke History: 0 ?Vascular Disease History: 1 ?Age Score: 1 ?Gender Score: 0 ?  ?  ?  ?CrCl 76 ml/min ?  ?Per office protocol, patient can hold Eliquis for 2 days prior to procedure. Patient should ideally resume Eliquis 24 hours post procedure. ?  ? ?I will route this recommendation to the requesting party via Epic fax function and remove from pre-op pool. Please call with questions. ? ?Ledora Bottcher, PA ?08/30/2021, 6:28 AM ? ?

## 2021-09-01 DIAGNOSIS — F411 Generalized anxiety disorder: Secondary | ICD-10-CM | POA: Diagnosis not present

## 2021-09-01 DIAGNOSIS — R351 Nocturia: Secondary | ICD-10-CM | POA: Diagnosis not present

## 2021-09-01 DIAGNOSIS — Z6835 Body mass index (BMI) 35.0-35.9, adult: Secondary | ICD-10-CM | POA: Diagnosis not present

## 2021-09-01 DIAGNOSIS — N400 Enlarged prostate without lower urinary tract symptoms: Secondary | ICD-10-CM | POA: Diagnosis not present

## 2021-09-01 DIAGNOSIS — I4891 Unspecified atrial fibrillation: Secondary | ICD-10-CM | POA: Diagnosis not present

## 2021-09-01 DIAGNOSIS — M170 Bilateral primary osteoarthritis of knee: Secondary | ICD-10-CM | POA: Diagnosis not present

## 2021-09-01 DIAGNOSIS — I2510 Atherosclerotic heart disease of native coronary artery without angina pectoris: Secondary | ICD-10-CM | POA: Diagnosis not present

## 2021-09-01 DIAGNOSIS — G4733 Obstructive sleep apnea (adult) (pediatric): Secondary | ICD-10-CM | POA: Diagnosis not present

## 2021-09-13 ENCOUNTER — Telehealth: Payer: Self-pay

## 2021-09-13 ENCOUNTER — Ambulatory Visit: Payer: PPO | Admitting: Internal Medicine

## 2021-09-13 ENCOUNTER — Encounter: Payer: Self-pay | Admitting: Internal Medicine

## 2021-09-13 VITALS — BP 110/80 | HR 79 | Temp 97.5°F | Ht 67.5 in | Wt 225.8 lb

## 2021-09-13 DIAGNOSIS — Z86010 Personal history of colonic polyps: Secondary | ICD-10-CM | POA: Diagnosis not present

## 2021-09-13 DIAGNOSIS — F419 Anxiety disorder, unspecified: Secondary | ICD-10-CM | POA: Diagnosis not present

## 2021-09-13 DIAGNOSIS — G4733 Obstructive sleep apnea (adult) (pediatric): Secondary | ICD-10-CM | POA: Diagnosis not present

## 2021-09-13 NOTE — Patient Instructions (Signed)
It was good to see you again today! ? ?As discussed, we will schedule a surveillance colonoscopy-history of colon polyps-ASA 3 ? ?You will need to come off your Eliquis 2 days prior to the procedure.  We will reach out to Dr. Harl Bowie and get his okay. ? ?Further recommendations to follow. ?

## 2021-09-13 NOTE — Telephone Encounter (Signed)
Clinical pharmacist to review Eliquis 

## 2021-09-13 NOTE — Progress Notes (Signed)
? ? ?Primary Care Physician:  Curlene Labrum, MD ?Primary Gastroenterologist:  Dr. Gala Romney ? ?Pre-Procedure History & Physical: ?HPI:  Brian Leach is a 71 y.o. male here for here for setting up a surveillance colonoscopy.  History of multiple serrated and adenomatous polyps removed from his colon 2019.  He took a large amount of conscious sedation medication at that time.  He is done well.  He is due for surveillance at this time. ?He is on Eliquis for atrial fibrillation.  History of DVT/PE last year.  Diagnosed with sleep apnea.  His CPAP machine has not arrived as of yet. ? ?Past Medical History:  ?Diagnosis Date  ? Anxiety   ? Arthritis   ? BPH (benign prostatic hyperplasia)   ? Complication of anesthesia   ? difficult intubation  ? GERD (gastroesophageal reflux disease)   ? History of gout   ? Hyperlipidemia   ? Left leg DVT ([place]) 11/2020  ? Paroxysmal atrial fibrillation (HCC)   ? Pre-diabetes   ? Pulmonary embolism ([place]) 11/2020  ? Seasonal allergies   ? ? ?Past Surgical History:  ?Procedure Laterality Date  ? CARDIOVERSION N/A 04/27/2021  ? Procedure: CARDIOVERSION;  Surgeon: Satira Sark, MD;  Location: [facility];  Service: Cardiovascular;  Laterality: N/A;  ? COLONOSCOPY N/A 04/07/2013  ? Procedure: COLONOSCOPY;  Surgeon: Daneil Dolin, MD;  Location: [facility];  Service: Endoscopy;  Laterality: N/A;  7:30 AM  ? COLONOSCOPY N/A 05/14/2018  ? Procedure: COLONOSCOPY;  Surgeon: Daneil Dolin, MD;  Location: [facility];  Service: Endoscopy;  Laterality: N/A;  10:30  ? KNEE ARTHROSCOPY Left   ? POLYPECTOMY  05/14/2018  ? Procedure: POLYPECTOMY;  Surgeon: Daneil Dolin, MD;  Location: [facility];  Service: Endoscopy;;  colon ?  ? ? ?Prior to Admission medications   ?Medication Sig Start Date End Date Taking? Authorizing Provider  ?acetaminophen (TYLENOL) 500 MG tablet Take 500-1,000 mg by mouth every 8 (eight) hours as needed for moderate pain.   Yes [provider]  ?apixaban  (ELIQUIS) 5 MG TABS tablet Take 5 mg by mouth 2 (two) times daily.   Yes [provider]  ?azelastine (ASTELIN) 0.1 % nasal spray Place 1 spray into both nostrils 2 (two) times daily as needed for rhinitis. Use in each nostril as directed   Yes [provider]  ?diltiazem (CARDIZEM CD) 180 MG 24 hr capsule Take 180 mg by mouth daily.   Yes [provider]  ?loratadine (CLARITIN) 10 MG tablet Take 10 mg by mouth daily as needed for allergies.   Yes [provider]  ?rosuvastatin (CRESTOR) 10 MG tablet Take 1 tablet (10 mg total) by mouth daily at 6 PM. 11/27/20  Yes Tat, Shanon Brow, MD  ?sertraline (ZOLOFT) 50 MG tablet Take 50 mg by mouth daily. 09/01/21  Yes [provider]  ? ? ?Allergies as of 09/13/2021 - Review Complete 09/13/2021  ?Allergen Reaction Noted  ? Codeine Rash 02/25/2013  ? ? ?Family History  ?Problem Relation Age of Onset  ? Heart disease Brother   ? ? ?Social History  ? ?Socioeconomic History  ? Marital status: Single  ?  Spouse name: Not on file  ? Number of children: Not on file  ? Years of education: Not on file  ? Highest education level: Not on file  ?Occupational History  ? Not on file  ?Tobacco Use  ? Smoking status: Never  ? Smokeless tobacco: Never  ?Vaping  Use  ? Vaping Use: Never used  ?Substance and Sexual Activity  ? Alcohol use: No  ? Drug use: No  ? Sexual activity: Yes  ?  Birth control/protection: None  ?Other Topics Concern  ? Not on file  ?Social History Narrative  ? Not on file  ? ?Social Determinants of Health  ? ?Financial Resource Strain: Not on file  ?Food Insecurity: Not on file  ?Transportation Needs: Not on file  ?Physical Activity: Not on file  ?Stress: Not on file  ?Social Connections: Not on file  ?Intimate Partner Violence: Not on file  ? ? ?Review of Systems: ?See HPI, otherwise negative ROS ? ?Physical Exam: ?BP 110/80 (BP Location: Right Arm, Patient Position: Sitting, Cuff Size: Normal)   Pulse 79   Temp (!) 97.5 ?F (36.4  ?C) (Temporal)   Ht 5' 7.5" (1.715 m)   Wt 225 lb 12.8 oz (102.4 kg)   SpO2 95%   BMI 34.84 kg/m?  ?General:   Alert,  Well-developed, well-nourished, pleasant and cooperative in NAD ?Neck:  Supple; no masses or thyromegaly. No significant cervical adenopathy. ?Lungs:  Clear throughout to auscultation.   No wheezes, crackles, or rhonchi. No acute distress. ?Heart: Regular rhythm with occasional ectopic beats., rubs,  or gallops. ?Abdomen: Non-distended, normal bowel sounds.  Soft and nontender without appreciable mass or hepatosplenomegaly.  ?Pulses:  Normal pulses noted. ?Extremities:  Without clubbing or edema. ? ?Impression/Plan: 71 year old gentleman history multiple colonic polyps removed 2019; due for surveillance colonoscopy at this time.  I have discussed the risk benefits imitations alternatives and imponderables of colonoscopy now.  Patient's questions have been answered.  All parties agreeable. ? ?Recommendations: ? ?We will schedule a surveillance colonoscopy in the near future.  He will need to come off Eliquis for 2 days as okayed with his cardiologist. ?Further recommendations to follow after the procedures been performed.  ASA 3. ? ? ? ? ?Notice: This dictation was prepared with Dragon dictation along with smaller phrase technology. Any transcriptional errors that result from this process are unintentional and may not be corrected upon review.  ?

## 2021-09-13 NOTE — Telephone Encounter (Signed)
Patient with diagnosis of A Fib, PE/DVT on Eliquis for anticoagulation.   ? ?Procedure: colonoscopy ?Date of procedure: TBD ? ? ?CHA2DS2-VASc Score = 2  ?This indicates a 2.2% annual risk of stroke. ?The patient's score is based upon: ?CHF History: 0 ?HTN History: 0 ?Diabetes History: 0 ?Stroke History: 0 ?Vascular Disease History: 1 ?Age Score: 1 ?Gender Score: 0 ?  ?CrCl 86 mL/min ?Platelet count 233K ? ?Per office protocol, patient can hold Eliquis for 1 day prior to procedure.   ?

## 2021-09-13 NOTE — Telephone Encounter (Signed)
Attention: Preop ? ? ?We would like to request holding the following medication for patient please. ? ?Procedure: Colonoscopy ? ?Date: TBD ? ?Medication to hold: Eliquis x 48 hrs ? ?Surgeon: Dr. Gala Romney ? ?Phone: [phone] ? ?Fax:  [phone] ? ?Type of Anesthesia: Propofol ? ? ? ? ? ? ?  ?

## 2021-09-14 NOTE — Telephone Encounter (Signed)
Ok to hold Eliquis x48 hrs has been approved.  ?

## 2021-09-14 NOTE — Telephone Encounter (Signed)
? ?  Name: Brian Leach  ?DOB: 09-24-1950  ?MRN: [id number]  ? ?Primary Cardiologist: Carlyle Dolly, MD ? ?Chart reviewed as part of pre-operative protocol coverage. Patient was contacted 09/14/2021 in reference to pre-operative risk assessment for pending surgery as outlined below.  JEROME VIGLIONE was last seen on 06/07/21 by Dr. Harl Bowie.  Since that day, WEYLYN RICCIUTI has done well form a cardiac standpoint. ? ?Therefore, based on ACC/AHA guidelines, the patient would be at acceptable risk for the planned procedure without further cardiovascular testing.  ? ?The patient was advised that if he develops new symptoms prior to surgery to contact our office to arrange for a follow-up visit, and he verbalized understanding. ? ?Patient with diagnosis of A Fib, PE/DVT on Eliquis for anticoagulation.   ?  ?Procedure: colonoscopy ?Date of procedure: TBD ?  ?  ?CHA2DS2-VASc Score = 2  ?This indicates a 2.2% annual risk of stroke. ?The patient's score is based upon: ?CHF History: 0 ?HTN History: 0 ?Diabetes History: 0 ?Stroke History: 0 ?Vascular Disease History: 1 ?Age Score: 1 ?Gender Score: 0 ?  ?CrCl 86 mL/min ?Platelet count 233K ?  ?Per office protocol, patient can hold Eliquis for 1 day prior to procedure.   ? ?I will route this recommendation to the requesting party via Epic fax function and remove from pre-op pool. Please call with questions. ? ?Lenna Sciara, NP ?09/14/2021, 4:47 PM ? ?

## 2021-09-14 NOTE — Telephone Encounter (Signed)
? ?  Patient Name: Brian Leach  ?DOB: Jul 11, 1950 ?MRN: [id number] ? ?Primary Cardiologist: Carlyle Dolly, MD ? ?Chart reviewed as part of pre-operative protocol coverage.  ? ?Per pharmacy recommendation, patient may hold Eliquis 1 day prior to colonoscopy.   ? ?Attempted to contact patient to discuss this recommendation. No answer. Left voicemail for patient to return call at earliest convenience. ? ? ?Lenna Sciara, NP ?09/14/2021, 1:47 PM ? ? ?

## 2021-09-15 MED ORDER — NA SULFATE-K SULFATE-MG SULF 17.5-3.13-1.6 GM/177ML PO SOLN: 1.0000 | ORAL | 0 refills | Status: DC

## 2021-09-15 NOTE — Telephone Encounter (Signed)
Clarified and they are recommending only holding Eliquis 1 day.  ?

## 2021-09-15 NOTE — Telephone Encounter (Signed)
Just wanting to clarify the amount of days that the patient can hold Eliquis. We requested to hold for 2 days. Can the patient only hold for 1 day? ?

## 2021-09-15 NOTE — Telephone Encounter (Addendum)
Dr. Gala Romney had requested to hold Eliquis for 48 hours prior to procedure. Ok was given for pt to hold Eliquis for one day before procedure. Please get clarification. ?

## 2021-09-15 NOTE — Telephone Encounter (Signed)
Called pt, spoke to him and wife, TCS scheduled for 10/27/21 at 1:00pm. He requested Suprep and is aware insurance may not cover. He will pay for prep if needed. Rx for Suprep sent to pharmacy. Orders entered. ? ? ?

## 2021-09-15 NOTE — Telephone Encounter (Signed)
Tried to call pt, LMOAM for return call. ?

## 2021-09-15 NOTE — Telephone Encounter (Signed)
Pre-op appt 10/24/21. Appt letter mailed with procedure instructions. ?

## 2021-09-15 NOTE — Addendum Note (Signed)
Addended by: Hassan Rowan on: 09/15/2021 03:45 PM ? ? Modules accepted: Orders ? ?

## 2021-10-08 DIAGNOSIS — Z6834 Body mass index (BMI) 34.0-34.9, adult: Secondary | ICD-10-CM | POA: Diagnosis not present

## 2021-10-08 DIAGNOSIS — M25569 Pain in unspecified knee: Secondary | ICD-10-CM | POA: Diagnosis not present

## 2021-10-10 DIAGNOSIS — H6123 Impacted cerumen, bilateral: Secondary | ICD-10-CM | POA: Diagnosis not present

## 2021-10-10 DIAGNOSIS — H9203 Otalgia, bilateral: Secondary | ICD-10-CM | POA: Diagnosis not present

## 2021-10-10 DIAGNOSIS — H6122 Impacted cerumen, left ear: Secondary | ICD-10-CM | POA: Diagnosis not present

## 2021-10-10 DIAGNOSIS — H6121 Impacted cerumen, right ear: Secondary | ICD-10-CM | POA: Diagnosis not present

## 2021-10-10 DIAGNOSIS — Z6835 Body mass index (BMI) 35.0-35.9, adult: Secondary | ICD-10-CM | POA: Diagnosis not present

## 2021-10-13 DIAGNOSIS — F419 Anxiety disorder, unspecified: Secondary | ICD-10-CM | POA: Diagnosis not present

## 2021-10-13 DIAGNOSIS — G4733 Obstructive sleep apnea (adult) (pediatric): Secondary | ICD-10-CM | POA: Diagnosis not present

## 2021-10-19 DIAGNOSIS — G4733 Obstructive sleep apnea (adult) (pediatric): Secondary | ICD-10-CM | POA: Diagnosis not present

## 2021-10-19 DIAGNOSIS — M170 Bilateral primary osteoarthritis of knee: Secondary | ICD-10-CM | POA: Diagnosis not present

## 2021-10-19 DIAGNOSIS — J019 Acute sinusitis, unspecified: Secondary | ICD-10-CM | POA: Diagnosis not present

## 2021-10-19 DIAGNOSIS — I480 Paroxysmal atrial fibrillation: Secondary | ICD-10-CM | POA: Diagnosis not present

## 2021-10-19 DIAGNOSIS — F411 Generalized anxiety disorder: Secondary | ICD-10-CM | POA: Diagnosis not present

## 2021-10-19 DIAGNOSIS — Z6834 Body mass index (BMI) 34.0-34.9, adult: Secondary | ICD-10-CM | POA: Diagnosis not present

## 2021-10-21 NOTE — Patient Instructions (Signed)
? ? ? ? ? ? Ane Payment Winther ? 10/21/2021  ?  ? '@PREFPERIOPPHARMACY'$ @ ? ? Your procedure is scheduled on 10/27/2021. ? ? Report to [facility] at  1130  A.M. ? ? Call this number if you have problems the morning of surgery: ? [phone] ? ? Remember: ? Follow the diet and prep instructions given to you by the office. ? ?  Your last dose of eliquis should be on 10/25/2021. ?  ? Take these medicines the morning of surgery with A SIP OF WATER  ? ? ?                          Diltiazem, claritin, zoloft. ?  ? ? Do not wear jewelry, make-up or nail polish. ? Do not wear lotions, powders, or perfumes, or deodorant. ? Do not shave 48 hours prior to surgery.  Men may shave face and neck. ? Do not bring valuables to the hospital. ? [facility] is not responsible for any belongings or valuables. ? ?Contacts, dentures or bridgework may not be worn into surgery.  Leave your suitcase in the car.  After surgery it may be brought to your room. ? ?For patients admitted to the hospital, discharge time will be determined by your treatment team. ? ?Patients discharged the day of surgery will not be allowed to drive home and must have someone with them for 24 hours.  ? ? ?Special instructions:   DO NOT smoke tobacco or vape for 24 hours before your procedure. ? ?Please read over the following fact sheets that you were given. ?Anesthesia Post-op Instructions and Care and Recovery After Surgery ?  ? ? ? Colonoscopy, Adult, Care After ?The following information offers guidance on how to care for yourself after your procedure. Your health care provider may also give you more specific instructions. If you have problems or questions, contact your health care provider. ?What can I expect after the procedure? ?After the procedure, it is common to have: ?A small amount of blood in your stool for 24 hours after the procedure. ?Some gas. ?Mild cramping or bloating of your abdomen. ?Follow these instructions at home: ?Eating and drinking ? ?Drink  enough fluid to keep your urine pale yellow. ?Follow instructions from your health care provider about eating or drinking restrictions. ?Resume your normal diet as told by your health care provider. Avoid heavy or fried foods that are hard to digest. ?Activity ?Rest as told by your health care provider. ?Avoid sitting for a long time without moving. Get up to take short walks every 1-2 hours. This is important to improve blood flow and breathing. Ask for help if you feel weak or unsteady. ?Return to your normal activities as told by your health care provider. Ask your health care provider what activities are safe for you. ?Managing cramping and bloating ? ?Try walking around when you have cramps or feel bloated. ?If directed, apply heat to your abdomen as told by your health care provider. Use the heat source that your health care provider recommends, such as a moist heat pack or a heating pad. ?Place a towel between your skin and the heat source. ?Leave the heat on for 20-30 minutes. ?Remove the heat if your skin turns bright red. This is especially important if you are unable to feel pain, heat, or cold. You have a greater risk of getting burned. ?General instructions ?If you were given a sedative during the procedure, it  can affect you for several hours. Do not drive or operate machinery until your health care provider says that it is safe. ?For the first 24 hours after the procedure: ?Do not sign important documents. ?Do not drink alcohol. ?Do your regular daily activities at a slower pace than normal. ?Eat soft foods that are easy to digest. ?Take over-the-counter and prescription medicines only as told by your health care provider. ?Keep all follow-up visits. This is important. ?Contact a health care provider if: ?You have blood in your stool 2-3 days after the procedure. ?Get help right away if: ?You have more than a small spotting of blood in your stool. ?You have large blood clots in your stool. ?You have  swelling of your abdomen. ?You have nausea or vomiting. ?You have a fever. ?You have increasing pain in your abdomen that is not relieved with medicine. ?These symptoms may be an emergency. Get help right away. Call 911. ?Do not wait to see if the symptoms will go away. ?Do not drive yourself to the hospital. ?Summary ?After the procedure, it is common to have a small amount of blood in your stool. You may also have mild cramping and bloating of your abdomen. ?If you were given a sedative during the procedure, it can affect you for several hours. Do not drive or operate machinery until your health care provider says that it is safe. ?Get help right away if you have a lot of blood in your stool, nausea or vomiting, a fever, or increased pain in your abdomen. ?This information is not intended to replace advice given to you by your health care provider. Make sure you discuss any questions you have with your health care provider. ?Document Revised: 01/19/2021 Document Reviewed: 01/19/2021 ?Elsevier Patient Education ? [place]. ?Monitored Anesthesia Care, Care After ?This sheet gives you information about how to care for yourself after your procedure. Your health care provider may also give you more specific instructions. If you have problems or questions, contact your health care provider. ?What can I expect after the procedure? ?After the procedure, it is common to have: ?Tiredness. ?Forgetfulness about what happened after the procedure. ?Impaired judgment for important decisions. ?Nausea or vomiting. ?Some difficulty with balance. ?Follow these instructions at home: ?For the time period you were told by your health care provider: ? ?  ? ?Rest as needed. ?Do not participate in activities where you could fall or become injured. ?Do not drive or use machinery. ?Do not drink alcohol. ?Do not take sleeping pills or medicines that cause drowsiness. ?Do not make important decisions or sign legal documents. ?Do not  take care of children on your own. ?Eating and drinking ?Follow the diet that is recommended by your health care provider. ?Drink enough fluid to keep your urine pale yellow. ?If you vomit: ?Drink water, juice, or soup when you can drink without vomiting. ?Make sure you have little or no nausea before eating solid foods. ?General instructions ?Have a responsible adult stay with you for the time you are told. It is important to have someone help care for you until you are awake and alert. ?Take over-the-counter and prescription medicines only as told by your health care provider. ?If you have sleep apnea, surgery and certain medicines can increase your risk for breathing problems. Follow instructions from your health care provider about wearing your sleep device: ?Anytime you are sleeping, including during daytime naps. ?While taking prescription pain medicines, sleeping medicines, or medicines that make you drowsy. ?Avoid  smoking. ?Keep all follow-up visits as told by your health care provider. This is important. ?Contact a health care provider if: ?You keep feeling nauseous or you keep vomiting. ?You feel light-headed. ?You are still sleepy or having trouble with balance after 24 hours. ?You develop a rash. ?You have a fever. ?You have redness or swelling around the IV site. ?Get help right away if: ?You have trouble breathing. ?You have new-onset confusion at home. ?Summary ?For several hours after your procedure, you may feel tired. You may also be forgetful and have poor judgment. ?Have a responsible adult stay with you for the time you are told. It is important to have someone help care for you until you are awake and alert. ?Rest as told. Do not drive or operate machinery. Do not drink alcohol or take sleeping pills. ?Get help right away if you have trouble breathing, or if you suddenly become confused. ?This information is not intended to replace advice given to you by your health care provider. Make sure you  discuss any questions you have with your health care provider. ?Document Revised: 05/03/2021 Document Reviewed: 05/01/2019 ?Elsevier Patient Education ? [place]. ? ?

## 2021-10-24 ENCOUNTER — Encounter (HOSPITAL_COMMUNITY): Payer: Self-pay

## 2021-10-24 ENCOUNTER — Encounter (HOSPITAL_COMMUNITY)
Admission: RE | Admit: 2021-10-24 | Discharge: 2021-10-24 | Disposition: A | Discharge status: Home / Self Care | Payer: PPO | Source: Ambulatory Visit | Attending: Internal Medicine | Admitting: Internal Medicine

## 2021-10-24 VITALS — BP 126/87 | HR 95 | Temp 97.5°F | Resp 18 | Ht 67.5 in | Wt 223.0 lb

## 2021-10-24 DIAGNOSIS — R7303 Prediabetes: Secondary | ICD-10-CM | POA: Insufficient documentation

## 2021-10-24 DIAGNOSIS — Z01812 Encounter for preprocedural laboratory examination: Secondary | ICD-10-CM | POA: Diagnosis not present

## 2021-10-24 HISTORY — DX: Sleep apnea, unspecified: G47.30

## 2021-10-24 HISTORY — DX: Type 2 diabetes mellitus without complications: E11.9

## 2021-10-24 HISTORY — DX: Cardiac arrhythmia, unspecified: I49.9

## 2021-10-24 LAB — BASIC METABOLIC PANEL
Anion gap: 6 (ref 5–15)
BUN: 23 mg/dL (ref 8–23)
CO2: 22 mmol/L (ref 22–32)
Calcium: 8.8 mg/dL — ABNORMAL LOW (ref 8.9–10.3)
Chloride: 107 mmol/L (ref 98–111)
Creatinine, Ser: 0.83 mg/dL (ref 0.61–1.24)
GFR, Estimated: >60 mL/min (ref >60)
Glucose, Bld: 90 mg/dL (ref 70–99)
Potassium: 4.4 mmol/L (ref 3.5–5.1)
Sodium: 135 mmol/L (ref 135–145)

## 2021-10-27 ENCOUNTER — Encounter (HOSPITAL_COMMUNITY)
Admission: RE | Disposition: A | Discharge status: Home / Self Care | Payer: Self-pay | Source: Home / Self Care | Attending: Internal Medicine

## 2021-10-27 ENCOUNTER — Encounter (HOSPITAL_COMMUNITY): Payer: Self-pay | Admitting: Internal Medicine

## 2021-10-27 ENCOUNTER — Ambulatory Visit (HOSPITAL_COMMUNITY): Payer: PPO | Admitting: Anesthesiology

## 2021-10-27 ENCOUNTER — Ambulatory Visit (HOSPITAL_COMMUNITY)
Admission: RE | Admit: 2021-10-27 | Discharge: 2021-10-27 | Disposition: A | Discharge status: Home / Self Care | Payer: PPO | Attending: Internal Medicine | Admitting: Internal Medicine

## 2021-10-27 ENCOUNTER — Ambulatory Visit (HOSPITAL_BASED_OUTPATIENT_CLINIC_OR_DEPARTMENT_OTHER): Payer: PPO | Admitting: Anesthesiology

## 2021-10-27 DIAGNOSIS — I4891 Unspecified atrial fibrillation: Secondary | ICD-10-CM

## 2021-10-27 DIAGNOSIS — E119 Type 2 diabetes mellitus without complications: Secondary | ICD-10-CM | POA: Insufficient documentation

## 2021-10-27 DIAGNOSIS — K641 Second degree hemorrhoids: Secondary | ICD-10-CM | POA: Insufficient documentation

## 2021-10-27 DIAGNOSIS — I471 Supraventricular tachycardia: Secondary | ICD-10-CM

## 2021-10-27 DIAGNOSIS — Z86010 Personal history of colonic polyps: Secondary | ICD-10-CM | POA: Diagnosis not present

## 2021-10-27 DIAGNOSIS — Z86711 Personal history of pulmonary embolism: Secondary | ICD-10-CM | POA: Diagnosis not present

## 2021-10-27 DIAGNOSIS — Z7901 Long term (current) use of anticoagulants: Secondary | ICD-10-CM | POA: Insufficient documentation

## 2021-10-27 DIAGNOSIS — I824Y2 Acute embolism and thrombosis of unspecified deep veins of left proximal lower extremity: Secondary | ICD-10-CM

## 2021-10-27 DIAGNOSIS — Z1211 Encounter for screening for malignant neoplasm of colon: Secondary | ICD-10-CM | POA: Insufficient documentation

## 2021-10-27 DIAGNOSIS — D123 Benign neoplasm of transverse colon: Secondary | ICD-10-CM | POA: Diagnosis not present

## 2021-10-27 DIAGNOSIS — I2699 Other pulmonary embolism without acute cor pulmonale: Secondary | ICD-10-CM

## 2021-10-27 DIAGNOSIS — Z86718 Personal history of other venous thrombosis and embolism: Secondary | ICD-10-CM | POA: Diagnosis not present

## 2021-10-27 DIAGNOSIS — I4819 Other persistent atrial fibrillation: Secondary | ICD-10-CM

## 2021-10-27 DIAGNOSIS — G4730 Sleep apnea, unspecified: Secondary | ICD-10-CM | POA: Diagnosis not present

## 2021-10-27 DIAGNOSIS — K219 Gastro-esophageal reflux disease without esophagitis: Secondary | ICD-10-CM | POA: Insufficient documentation

## 2021-10-27 DIAGNOSIS — Z7984 Long term (current) use of oral hypoglycemic drugs: Secondary | ICD-10-CM | POA: Diagnosis not present

## 2021-10-27 DIAGNOSIS — K635 Polyp of colon: Secondary | ICD-10-CM

## 2021-10-27 DIAGNOSIS — I480 Paroxysmal atrial fibrillation: Secondary | ICD-10-CM

## 2021-10-27 HISTORY — PX: POLYPECTOMY: SHX5525

## 2021-10-27 HISTORY — PX: COLONOSCOPY WITH PROPOFOL: SHX5780

## 2021-10-27 LAB — GLUCOSE, CAPILLARY: Glucose-Capillary: 98 mg/dL (ref 70–99)

## 2021-10-27 SURGERY — COLONOSCOPY WITH PROPOFOL
Anesthesia: General

## 2021-10-27 MED ORDER — PROPOFOL 500 MG/50ML IV EMUL
INTRAVENOUS | Status: DC | PRN
  Administered 2021-10-27: 150 ug/kg/min via INTRAVENOUS

## 2021-10-27 MED ORDER — LIDOCAINE HCL (CARDIAC) PF 100 MG/5ML IV SOSY
PREFILLED_SYRINGE | INTRAVENOUS | Status: DC | PRN
  Administered 2021-10-27: 50 mg via INTRAVENOUS

## 2021-10-27 MED ORDER — LACTATED RINGERS IV SOLN: INTRAVENOUS | Status: DC

## 2021-10-27 MED ORDER — PROPOFOL 10 MG/ML IV BOLUS
INTRAVENOUS | Status: DC | PRN
  Administered 2021-10-27: 100 mg via INTRAVENOUS

## 2021-10-27 NOTE — Progress Notes (Signed)
Per Dr. Gala Romney  patient may restart Eliquis tonight based on Dr. Harl Bowie recommendations.  Post polypectomy bleeding instructions reviewed with wife.  Wife verbalized understanding.

## 2021-10-27 NOTE — Op Note (Signed)
[facility] Patient Name: Brian Leach Procedure Date: 10/27/2021 1:47 PM MRN: [id number] Date of Birth: 12/27/1950 Attending MD: Norvel Richards , MD CSN: [id number] Age: 71 Admit Type: Outpatient Procedure:                Colonoscopy Indications:              High risk colon cancer surveillance: Personal                            history of colonic polyps Providers:                Norvel Richards, MD, Lambert Mody,                            Kristine L. Risa Grill, Technician Referring MD:              Medicines:                Propofol per Anesthesia Complications:            No immediate complications. Estimated Blood Loss:     Estimated blood loss was minimal. Procedure:                Pre-Anesthesia Assessment:                           - Prior to the procedure, a History and Physical                            was performed, and patient medications and                            allergies were reviewed. The patient's tolerance of                            previous anesthesia was also reviewed. The risks                            and benefits of the procedure and the sedation                            options and risks were discussed with the patient.                            All questions were answered, and informed consent                            was obtained. Prior Anticoagulants: The patient has                            taken no previous anticoagulant or antiplatelet                            agents. ASA Grade Assessment: III - A patient with  severe systemic disease. After reviewing the risks                            and benefits, the patient was deemed in                            satisfactory condition to undergo the procedure.                           After obtaining informed consent, the colonoscope                            was passed under direct vision. Throughout the                             procedure, the patient's blood pressure, pulse, and                            oxygen saturations were monitored continuously. The                            [id number]) scope was introduced through the                            anus and advanced to the the cecum, identified by                            appendiceal orifice and ileocecal valve. The                            colonoscopy was performed without difficulty. The                            patient tolerated the procedure well. The quality                            of the bowel preparation was adequate. Scope In: 2:15:53 PM Scope Out: 2:31:21 PM Scope Withdrawal Time: 0 hours 9 minutes 43 seconds  Total Procedure Duration: 0 hours 15 minutes 28 seconds  Findings:      The perianal and digital rectal examinations were normal.      Non-bleeding internal hemorrhoids were found during retroflexion. The       hemorrhoids were moderate, medium-sized and Grade II (internal       hemorrhoids that prolapse but reduce spontaneously).      A 5 mm polyp was found in the hepatic flexure. The polyp was       semi-pedunculated. The polyp was removed with a cold snare. Resection       and retrieval were complete. Estimated blood loss was minimal.      The exam was otherwise without abnormality on direct and retroflexion       views. Impression:               - Non-bleeding internal hemorrhoids.                           -  One 5 mm polyp at the hepatic flexure, removed                            with a cold snare. Resected and retrieved.                           - The examination was otherwise normal on direct                            and retroflexion views. Moderate Sedation:      Moderate (conscious) sedation was personally administered by an       anesthesia professional. The following parameters were monitored: oxygen       saturation, heart rate, blood pressure, respiratory rate, EKG, adequacy       of pulmonary ventilation,  and response to care. Recommendation:           - Patient has a contact number available for                            emergencies. The signs and symptoms of potential                            delayed complications were discussed with the                            patient. Return to normal activities tomorrow.                            Written discharge instructions were provided to the                            patient.                           - Advance diet as tolerated.                           - Continue present medications.                           - Repeat colonoscopy date to be determined after                            pending pathology results are reviewed for                            surveillance.                           - Return to GI office (date not yet determined). Procedure Code(s):        --- Professional ---                           [phone], Colonoscopy, flexible; with removal of  tumor(s), polyp(s), or other lesion(s) by snare                            technique Diagnosis Code(s):        --- Professional ---                           Z86.010, Personal history of colonic polyps                           K63.5, Polyp of colon                           K64.1, Second degree hemorrhoids CPT copyright 2019 American Medical Association. All rights reserved. The codes documented in this report are preliminary and upon coder review may  be revised to meet current compliance requirements. Cristopher Estimable. Izaac Reisig, MD Norvel Richards, MD 10/27/2021 2:35:25 PM This report has been signed electronically. Number of Addenda: 0

## 2021-10-27 NOTE — Anesthesia Procedure Notes (Signed)
Date/Time: 10/27/2021 2:17 PM Performed by: Orlie Dakin, CRNA Pre-anesthesia Checklist: Patient identified, Emergency Drugs available, Suction available and Patient being monitored Patient Re-evaluated:Patient Re-evaluated prior to induction Oxygen Delivery Method: Nasal cannula Induction Type: IV induction Placement Confirmation: positive ETCO2

## 2021-10-27 NOTE — Congregational Nurse Program (Signed)
Patient wife Lugene verbalized "Dr. Harl Bowie did not want my husband to miss more than 2 doses of Eliquis. He missed last nights dose and this mornings dose". Advised wife I would contact Dr. Gala Romney and verify when to Restart Eliquis.

## 2021-10-27 NOTE — H&P (Signed)
@LOGO @   Primary Care Physician:  Curlene Labrum, MD Primary Gastroenterologist:  Dr. Gala Romney  Pre-Procedure History & Physical: HPI:  Brian Leach is a 71 y.o. male here for for follow-up colonoscopy-surveillance.  History of multiple adenomatous and serrated polyps removed his colon previously.  He has been on Eliquis.  That has been stopped for 1 day prior to this procedure.  Past Medical History:  Diagnosis Date   Anxiety    Arthritis    BPH (benign prostatic hyperplasia)    Complication of anesthesia    difficult intubation   Diabetes mellitus without complication (HCC)    Dysrhythmia    GERD (gastroesophageal reflux disease)    History of gout    Hyperlipidemia    Left leg DVT ([place]) 11/2020   Paroxysmal atrial fibrillation ([place])    Pulmonary embolism ([place]) 11/2020   Seasonal allergies    Sleep apnea     Past Surgical History:  Procedure Laterality Date   CARDIOVERSION N/A 04/27/2021   Procedure: CARDIOVERSION;  Surgeon: Satira Sark, MD;  Location: [facility];  Service: Cardiovascular;  Laterality: N/A;   COLONOSCOPY N/A 04/07/2013   Procedure: COLONOSCOPY;  Surgeon: Daneil Dolin, MD;  Location: [facility];  Service: Endoscopy;  Laterality: N/A;  7:30 AM   COLONOSCOPY N/A 05/14/2018   Procedure: COLONOSCOPY;  Surgeon: Daneil Dolin, MD;  Location: [facility];  Service: Endoscopy;  Laterality: N/A;  10:30   KNEE ARTHROSCOPY Left    POLYPECTOMY  05/14/2018   Procedure: POLYPECTOMY;  Surgeon: Daneil Dolin, MD;  Location: [facility];  Service: Endoscopy;;  colon     Prior to Admission medications   Medication Sig Start Date End Date Taking? Authorizing Provider  acetaminophen (TYLENOL) 500 MG tablet Take 500-1,000 mg by mouth every 8 (eight) hours as needed for moderate pain.   Yes [provider]  apixaban (ELIQUIS) 5 MG TABS tablet Take 5 mg by mouth 2 (two) times daily.   Yes [provider]  azelastine (ASTELIN) 0.1 % nasal  spray Place 1 spray into both nostrils 2 (two) times daily as needed for rhinitis. Use in each nostril as directed   Yes [provider]  diltiazem (CARDIZEM CD) 180 MG 24 hr capsule Take 180 mg by mouth daily.   Yes [provider]  loratadine (CLARITIN) 10 MG tablet Take 10 mg by mouth daily as needed for allergies.   Yes [provider]  Na Sulfate-K Sulfate-Mg Sulf (SUPREP BOWEL PREP KIT) 17.5-3.13-1.6 GM/177ML SOLN Take 1 kit by mouth as directed. 09/15/21  Yes Jareli Highland, Cristopher Estimable, MD  rosuvastatin (CRESTOR) 10 MG tablet Take 1 tablet (10 mg total) by mouth daily at 6 PM. 11/27/20  Yes Tat, Shanon Brow, MD  sertraline (ZOLOFT) 50 MG tablet Take 50 mg by mouth daily. 09/01/21  Yes [provider]    Allergies as of 09/15/2021 - Review Complete 09/13/2021  Allergen Reaction Noted   Codeine Rash 02/25/2013    Family History  Problem Relation Age of Onset   Heart disease Brother     Social History   Socioeconomic History   Marital status: Single    Spouse name: Not on file   Number of children: Not on file   Years of education: Not on file   Highest education level: Not on file  Occupational History   Not on file  Tobacco Use   Smoking status: Never   Smokeless tobacco: Never  Vaping Use  Vaping Use: Never used  Substance and Sexual Activity   Alcohol use: No   Drug use: No   Sexual activity: Yes    Birth control/protection: None  Other Topics Concern   Not on file  Social History Narrative   Not on file   Social Determinants of Health   Financial Resource Strain: Not on file  Food Insecurity: Not on file  Transportation Needs: Not on file  Physical Activity: Not on file  Stress: Not on file  Social Connections: Not on file  Intimate Partner Violence: Not on file    Review of Systems: See HPI, otherwise negative ROS  Physical Exam: BP (!) 127/101   Pulse (!) 105   Temp 98.2 F (36.8 C) (Oral)   Resp 19   SpO2 100%  General:    Alert,  Well-developed, well-nourished, pleasant and cooperative in NAD Neck:  Supple; no masses or thyromegaly. No significant cervical adenopathy. Lungs:  Clear throughout to auscultation.   No wheezes, crackles, or rhonchi. No acute distress. Heart:  Regular rate and rhythm; no murmurs, clicks, rubs,  or gallops. Abdomen: Non-distended, normal bowel sounds.  Soft and nontender without appreciable mass or hepatosplenomegaly.  Pulses:  Normal pulses noted. Extremities:  Without clubbing or edema.  Impression/Plan: 71 year old gentleman here for surveillance colonoscopy per plan. The risks, benefits, limitations, alternatives and imponderables have been reviewed with the patient. Questions have been answered. All parties are agreeable.       Notice: This dictation was prepared with Dragon dictation along with smaller phrase technology. Any transcriptional errors that result from this process are unintentional and may not be corrected upon review.

## 2021-10-27 NOTE — Anesthesia Preprocedure Evaluation (Signed)
Anesthesia Evaluation  Patient identified by MRN, date of birth, ID band Patient awake    Reviewed: Allergy & Precautions, NPO status , Patient's Chart, lab work & pertinent test results  History of Anesthesia Complications (+) history of anesthetic complications  Airway Mallampati: III  TM Distance: >3 FB Neck ROM: Full    Dental  (+) Missing, Dental Advisory Given [place] :   Pulmonary sleep apnea and Continuous Positive Airway Pressure Ventilation , PE   Pulmonary exam normal breath sounds clear to auscultation       Cardiovascular Exercise Tolerance: Good + DVT  + dysrhythmias Atrial Fibrillation  Rhythm:Irregular Rate:Tachycardia     Neuro/Psych PSYCHIATRIC DISORDERS Anxiety negative neurological ROS     GI/Hepatic Neg liver ROS, GERD  Controlled,  Endo/Other  diabetes, Well Controlled, Type 2, Oral Hypoglycemic Agents  Renal/GU negative Renal ROS  negative genitourinary   Musculoskeletal  (+) Arthritis ,   Abdominal   Peds negative pediatric ROS (+)  Hematology negative hematology ROS (+)   Anesthesia Other Findings   Reproductive/Obstetrics negative OB ROS                            Anesthesia Physical Anesthesia Plan  ASA: 3  Anesthesia Plan: General   Post-op Pain Management: Minimal or no pain anticipated   Induction: Intravenous  PONV Risk Score and Plan: Propofol infusion  Airway Management Planned: Nasal Cannula and Natural Airway  Additional Equipment:   Intra-op Plan:   Post-operative Plan:   Informed Consent: I have reviewed the patients History and Physical, chart, labs and discussed the procedure including the risks, benefits and alternatives for the proposed anesthesia with the patient or authorized representative who has indicated his/her understanding and acceptance.     Dental advisory given  Plan Discussed with: CRNA and Surgeon  Anesthesia  Plan Comments:         Anesthesia Quick Evaluation

## 2021-10-27 NOTE — Discharge Instructions (Addendum)
  Colonoscopy Discharge Instructions  Read the instructions outlined below and refer to this sheet in the next few weeks. These discharge instructions provide you with general information on caring for yourself after you leave the hospital. Your doctor may also give you specific instructions. While your treatment has been planned according to the most current medical practices available, unavoidable complications occasionally occur. If you have any problems or questions after discharge, call Dr. Gala Romney at [phone]. ACTIVITY You may resume your regular activity, but move at a slower pace for the next 24 hours.  Take frequent rest periods for the next 24 hours.  Walking will help get rid of the air and reduce the bloated feeling in your belly (abdomen).  No driving for 24 hours (because of the medicine (anesthesia) used during the test).   Do not sign any important legal documents or operate any machinery for 24 hours (because of the anesthesia used during the test).  NUTRITION Drink plenty of fluids.  You may resume your normal diet as instructed by your doctor.  Begin with a light meal and progress to your normal diet. Heavy or fried foods are harder to digest and may make you feel sick to your stomach (nauseated).  Avoid alcoholic beverages for 24 hours or as instructed.  MEDICATIONS You may resume your normal medications unless your doctor tells you otherwise.  WHAT YOU CAN EXPECT TODAY Some feelings of bloating in the abdomen.  Passage of more gas than usual.  Spotting of blood in your stool or on the toilet paper.  IF YOU HAD POLYPS REMOVED DURING THE COLONOSCOPY: No aspirin products for 7 days or as instructed.  No alcohol for 7 days or as instructed.  Eat a soft diet for the next 24 hours.  FINDING OUT THE RESULTS OF YOUR TEST Not all test results are available during your visit. If your test results are not back during the visit, make an appointment with your caregiver to find out the  results. Do not assume everything is normal if you have not heard from your caregiver or the medical facility. It is important for you to follow up on all of your test results.  SEEK IMMEDIATE MEDICAL ATTENTION IF: You have more than a spotting of blood in your stool.  Your belly is swollen (abdominal distention).  You are nauseated or vomiting.  You have a temperature over 101.  You have abdominal pain or discomfort that is severe or gets worse throughout the day.    1 polyp removed from your colon today  Further recommendations to follow pending review of pathology report  Resume Eliquis tonight as recommended by Dr. Harl Bowie  At patient request, I called Lugene Kroeker at [phone] findings and recommendations

## 2021-10-27 NOTE — Transfer of Care (Signed)
Immediate Anesthesia Transfer of Care Note  Patient: Brian Leach  Procedure(s) Performed: COLONOSCOPY WITH PROPOFOL POLYPECTOMY  Patient Location: Short Stay  Anesthesia Type:General  Level of Consciousness: drowsy  Airway & Oxygen Therapy: Patient Spontanous Breathing  Post-op Assessment: Report given to RN and Post -op Vital signs reviewed and stable  Post vital signs: Reviewed and stable  Last Vitals:  Vitals Value Taken Time  BP    Temp    Pulse    Resp    SpO2      Last Pain:  Vitals:   10/27/21 1413  TempSrc:   PainSc: 0-No pain         Complications: No notable events documented.

## 2021-10-27 NOTE — Anesthesia Postprocedure Evaluation (Signed)
Anesthesia Post Note  Patient: MITUL HALLOWELL  Procedure(s) Performed: COLONOSCOPY WITH PROPOFOL POLYPECTOMY  Patient location during evaluation: Phase II Anesthesia Type: General Level of consciousness: awake and alert and oriented Pain management: pain level controlled Vital Signs Assessment: post-procedure vital signs reviewed and stable Respiratory status: spontaneous breathing, nonlabored ventilation and respiratory function stable Cardiovascular status: blood pressure returned to baseline and stable Postop Assessment: no apparent nausea or vomiting Anesthetic complications: no   No notable events documented.   Last Vitals:  Vitals:   10/27/21 1434 10/27/21 1442  BP: 95/67 111/72  Pulse: 87 85  Resp: 18 18  Temp: 36.7 C   SpO2: 96% 96%    Last Pain:  Vitals:   10/27/21 1442  TempSrc:   PainSc: 0-No pain                 [place] Mullett C Fumi Guadron

## 2021-10-31 LAB — SURGICAL PATHOLOGY

## 2021-11-01 ENCOUNTER — Encounter: Payer: Self-pay | Admitting: Internal Medicine

## 2021-11-02 ENCOUNTER — Encounter (HOSPITAL_COMMUNITY): Payer: Self-pay | Admitting: Internal Medicine

## 2021-11-13 DIAGNOSIS — G4733 Obstructive sleep apnea (adult) (pediatric): Secondary | ICD-10-CM | POA: Diagnosis not present

## 2021-11-13 DIAGNOSIS — F419 Anxiety disorder, unspecified: Secondary | ICD-10-CM | POA: Diagnosis not present

## 2021-11-21 ENCOUNTER — Encounter: Payer: Self-pay | Admitting: Pulmonary Disease

## 2021-11-21 ENCOUNTER — Ambulatory Visit: Payer: PPO | Admitting: Pulmonary Disease

## 2021-11-21 VITALS — BP 128/70 | HR 67 | Temp 96.4°F | Ht 67.5 in | Wt 221.2 lb

## 2021-11-21 DIAGNOSIS — G4733 Obstructive sleep apnea (adult) (pediatric): Secondary | ICD-10-CM

## 2021-11-21 NOTE — Progress Notes (Signed)
[facility]  Chief Complaint  Patient presents with   Follow-up    Patient feels like CPAP "plugs up his sinuses and ears" patient does not feel like he sleeps good with the cpap on but sleeps better with it off.     Past Surgical History:  He  has a past surgical history that includes Knee arthroscopy (Left); Colonoscopy (N/A, 04/07/2013); Colonoscopy (N/A, 05/14/2018); polypectomy (05/14/2018); Cardioversion (N/A, 04/27/2021); Colonoscopy with propofol (N/A, 10/27/2021); and polypectomy (10/27/2021).  Past Medical History:  Anxiety, OA, GERD, Gout, Allergies, PE with Lt leg DVT June 2022, PAF, BPH, HLD  Constitutional:  BP 128/70 (BP Location: Left Arm, Patient Position: Sitting, Cuff Size: Normal)   Pulse 67   Temp (!) 96.4 F (35.8 C) (Oral)   Ht 5' 7.5" (1.715 m)   Wt 221 lb 3.2 oz (100.3 kg)   SpO2 98%   BMI 34.13 kg/m   Brief Summary:  Brian Leach is a 71 y.o. male with obstructive sleep apnea.      Subjective:   He is here with his wife.  He had sleep study in March.  Found to have mild sleep apnea.  Saw Eric Form.  Started on auto CPAP.  He is using a nasal mask.  He doesn't think he could tolerate any other type of mask.  He is getting sinus congestion and ear congestion from using CPAP.  He was told by his dentist that his sinus cavities are huge.  He has been using astepro.  He won't use steroid nose sprays.   Physical Exam:   Appearance - well kempt   ENMT - no sinus tenderness, no oral exudate, no LAN, Mallampati 4 airway, no stridor  Respiratory - equal breath sounds bilaterally, no wheezing or rales  CV - s1s2 regular rate and rhythm, no murmurs  Ext - no clubbing, no edema  Skin - no rashes  Psych - normal mood and affect    Chest Imaging:  CT angio chest 11/24/20 >> b/l PE, atherosclerosis  Sleep Tests:  PSG 08/14/21 >> AHI 9.7, SpO2 low 90%. Auto CPAP 10/19/21 to 11/17/21 >> used on 28 of 30  nights with average 5 hrs 33 min.  Average AHI 1.1 with median CPAP 6 and 95 th percentile CPAP 8 cm H2O  Cardiac Tests:  Doppler legs b/l 11/24/20 >> DVT Lt poplietal and distal femoral veins Echo 11/25/20 >> EF 60 to 65%, mild LVH  Social History:  He  reports that he has never smoked. He has never used smokeless tobacco. He reports that he does not drink alcohol and does not use drugs.  Family History:  His family history includes Heart disease in his brother.     Assessment/Plan:   Obstructive sleep apnea. - he is compliant with CPAP and reports benefit from therapy - main issue issue with CPAP use now is related to sinus congestion - he is using a nasal mask and would not want to try any other type of mask - he uses [employer] for his DME - order for current CPAP was placed 08/26/21 - will change his CPAP to 6 cm H2O - he will try adjusting the humidifier setting on his CPAP - he will check with his dentist about whether he is a candidate for an oral appliance  CPAP rhinitis. - he is using azelastine - he does not want to use any steroid medications  Paroxysmal atrial fibrillation, Unprovoked Pulmonary embolism. - followed  by Dr. Carlyle Dolly with [facility]  Time Spent Involved in Patient Care on Day of Examination:  37 minutes  Follow up:   Patient Instructions  Will have your CPAP set to 6 cm water pressure  You can adjust the humidifier setting on your CPAP based on what you feels is comfortable  You can check with your dentist to see if you are a candidate for an oral appliance to treat obstructive sleep apnea  Follow up in 6 months  Medication List:   Allergies as of 11/21/2021       Reactions   Codeine Rash        Medication List        Accurate as of November 21, 2021 11:55 AM. If you have any questions, ask your nurse or doctor.          STOP taking these medications    Na Sulfate-K Sulfate-Mg Sulf 17.5-3.13-1.6 GM/177ML Soln Commonly  known as: Suprep Bowel Prep Kit Stopped by: Chesley Mires, MD       TAKE these medications    acetaminophen 500 MG tablet Commonly known as: TYLENOL Take 500-1,000 mg by mouth every 8 (eight) hours as needed for moderate pain.   apixaban 5 MG Tabs tablet Commonly known as: ELIQUIS Take 5 mg by mouth 2 (two) times daily.   azelastine 0.1 % nasal spray Commonly known as: ASTELIN Place 1 spray into both nostrils 2 (two) times daily as needed for rhinitis. Use in each nostril as directed   diltiazem 180 MG 24 hr capsule Commonly known as: CARDIZEM CD Take 180 mg by mouth daily.   loratadine 10 MG tablet Commonly known as: CLARITIN Take 10 mg by mouth daily as needed for allergies.   rosuvastatin 10 MG tablet Commonly known as: CRESTOR Take 1 tablet (10 mg total) by mouth daily at 6 PM.   sertraline 50 MG tablet Commonly known as: ZOLOFT Take 50 mg by mouth daily.        Signature:  Chesley Mires, MD [place] Pager - [phone] 11/21/2021, 11:55 AM

## 2021-11-21 NOTE — Patient Instructions (Signed)
Will have your CPAP set to 6 cm water pressure  You can adjust the humidifier setting on your CPAP based on what you feels is comfortable  You can check with your dentist to see if you are a candidate for an oral appliance to treat obstructive sleep apnea  Follow up in 6 months

## 2021-11-29 DIAGNOSIS — E7800 Pure hypercholesterolemia, unspecified: Secondary | ICD-10-CM | POA: Diagnosis not present

## 2021-11-29 DIAGNOSIS — D72829 Elevated white blood cell count, unspecified: Secondary | ICD-10-CM | POA: Diagnosis not present

## 2021-11-29 DIAGNOSIS — R739 Hyperglycemia, unspecified: Secondary | ICD-10-CM | POA: Diagnosis not present

## 2021-11-29 DIAGNOSIS — E7801 Familial hypercholesterolemia: Secondary | ICD-10-CM | POA: Diagnosis not present

## 2021-12-02 DIAGNOSIS — F411 Generalized anxiety disorder: Secondary | ICD-10-CM | POA: Diagnosis not present

## 2021-12-02 DIAGNOSIS — E1169 Type 2 diabetes mellitus with other specified complication: Secondary | ICD-10-CM | POA: Diagnosis not present

## 2021-12-02 DIAGNOSIS — Z6834 Body mass index (BMI) 34.0-34.9, adult: Secondary | ICD-10-CM | POA: Diagnosis not present

## 2021-12-02 DIAGNOSIS — I2510 Atherosclerotic heart disease of native coronary artery without angina pectoris: Secondary | ICD-10-CM | POA: Diagnosis not present

## 2021-12-02 DIAGNOSIS — I480 Paroxysmal atrial fibrillation: Secondary | ICD-10-CM | POA: Diagnosis not present

## 2021-12-02 DIAGNOSIS — G4733 Obstructive sleep apnea (adult) (pediatric): Secondary | ICD-10-CM | POA: Diagnosis not present

## 2021-12-02 DIAGNOSIS — E7801 Familial hypercholesterolemia: Secondary | ICD-10-CM | POA: Diagnosis not present

## 2021-12-05 DIAGNOSIS — S80862A Insect bite (nonvenomous), left lower leg, initial encounter: Secondary | ICD-10-CM | POA: Diagnosis not present

## 2021-12-05 DIAGNOSIS — R030 Elevated blood-pressure reading, without diagnosis of hypertension: Secondary | ICD-10-CM | POA: Diagnosis not present

## 2021-12-05 DIAGNOSIS — B360 Pityriasis versicolor: Secondary | ICD-10-CM | POA: Diagnosis not present

## 2021-12-07 DIAGNOSIS — H40013 Open angle with borderline findings, low risk, bilateral: Secondary | ICD-10-CM | POA: Diagnosis not present

## 2021-12-07 DIAGNOSIS — H524 Presbyopia: Secondary | ICD-10-CM | POA: Diagnosis not present

## 2021-12-09 ENCOUNTER — Encounter: Payer: Self-pay | Admitting: Cardiology

## 2021-12-09 ENCOUNTER — Encounter: Payer: Self-pay | Admitting: *Deleted

## 2021-12-09 ENCOUNTER — Ambulatory Visit (INDEPENDENT_AMBULATORY_CARE_PROVIDER_SITE_OTHER): Payer: PPO | Admitting: Cardiology

## 2021-12-09 VITALS — BP 110/64 | HR 64 | Ht 67.5 in | Wt 221.0 lb

## 2021-12-09 DIAGNOSIS — Z86711 Personal history of pulmonary embolism: Secondary | ICD-10-CM

## 2021-12-09 DIAGNOSIS — I480 Paroxysmal atrial fibrillation: Secondary | ICD-10-CM

## 2021-12-09 DIAGNOSIS — D6869 Other thrombophilia: Secondary | ICD-10-CM | POA: Diagnosis not present

## 2021-12-09 NOTE — Progress Notes (Signed)
Clinical Summary [name] is a 71 y.o.male seen today for follow up of the following medical problems.    AFib -new diagnosis during 11/2020 admission with DVT/PE - self converted during that admission, started on diltiazem - recurrent afib as outpatient that was persistent, referred for DCCV - 04/2021 DCCV succesful but right back into afib minutes later   -no recent palpitations - compliant with meds - no bleeding on eliquis.      2.History of unprovoked DVT/PE - he is on eliquis       3. Hyperlipidemia - 11/2020 TC 186 TG 100 HDL 55 LDL 111 - reports recent labs with pcp - he is on crestor  4. OSA - followed by Dr Halford Chessman Past Medical History:  Diagnosis Date   Anxiety    Arthritis    BPH (benign prostatic hyperplasia)    Complication of anesthesia    difficult intubation   Diabetes mellitus without complication (HCC)    Dysrhythmia    GERD (gastroesophageal reflux disease)    History of gout    Hyperlipidemia    Left leg DVT ([place]) 11/2020   Paroxysmal atrial fibrillation (HCC)    Pulmonary embolism ([place]) 11/2020   Seasonal allergies    Sleep apnea      Allergies  Allergen Reactions   Codeine Rash     Current Outpatient Medications  Medication Sig Dispense Refill   acetaminophen (TYLENOL) 500 MG tablet Take 500-1,000 mg by mouth every 8 (eight) hours as needed for moderate pain.     apixaban (ELIQUIS) 5 MG TABS tablet Take 5 mg by mouth 2 (two) times daily.     azelastine (ASTELIN) 0.1 % nasal spray Place 1 spray into both nostrils 2 (two) times daily as needed for rhinitis. Use in each nostril as directed     diltiazem (CARDIZEM CD) 180 MG 24 hr capsule Take 180 mg by mouth daily.     loratadine (CLARITIN) 10 MG tablet Take 10 mg by mouth daily as needed for allergies.     rosuvastatin (CRESTOR) 10 MG tablet Take 1 tablet (10 mg total) by mouth daily at 6 PM. 30 tablet 1   sertraline (ZOLOFT) 50 MG tablet Take 50 mg by mouth daily.     No  current facility-administered medications for this visit.     Past Surgical History:  Procedure Laterality Date   CARDIOVERSION N/A 04/27/2021   Procedure: CARDIOVERSION;  Surgeon: Satira Sark, MD;  Location: [facility];  Service: Cardiovascular;  Laterality: N/A;   COLONOSCOPY N/A 04/07/2013   Procedure: COLONOSCOPY;  Surgeon: Daneil Dolin, MD;  Location: [facility];  Service: Endoscopy;  Laterality: N/A;  7:30 AM   COLONOSCOPY N/A 05/14/2018   Procedure: COLONOSCOPY;  Surgeon: Daneil Dolin, MD;  Location: [facility];  Service: Endoscopy;  Laterality: N/A;  10:30   COLONOSCOPY WITH PROPOFOL N/A 10/27/2021   Procedure: COLONOSCOPY WITH PROPOFOL;  Surgeon: Daneil Dolin, MD;  [facility]: [facility];  Service: Endoscopy;  Laterality: N/A;  1:00pm   KNEE ARTHROSCOPY Left    POLYPECTOMY  05/14/2018   Procedure: POLYPECTOMY;  Surgeon: Daneil Dolin, MD;  [facility];  Service: Endoscopy;;  colon    POLYPECTOMY  10/27/2021   Procedure: POLYPECTOMY;  Surgeon: Daneil Dolin, MD;  [facility];  Service: Endoscopy;;     Allergies  Allergen Reactions   Codeine Rash      Family History  Problem Relation  Age of Onset   Heart disease Brother      Social History Mr. Colpitts reports that he has never smoked. He has never used smokeless tobacco. Mr. Mccleery reports no history of alcohol use.   Review of Systems CONSTITUTIONAL: No weight loss, fever, chills, weakness or fatigue.  HEENT: Eyes: No visual loss, blurred vision, double vision or yellow sclerae.No hearing loss, sneezing, congestion, runny nose or sore throat.  SKIN: No rash or itching.  CARDIOVASCULAR: per hpi RESPIRATORY: No shortness of breath, cough or sputum.  GASTROINTESTINAL: No anorexia, nausea, vomiting or diarrhea. No abdominal pain or blood.  GENITOURINARY: No burning on urination, no polyuria NEUROLOGICAL: No headache, dizziness, syncope, paralysis, ataxia, numbness or  tingling in the extremities. No change in bowel or bladder control.  MUSCULOSKELETAL: No muscle, back pain, joint pain or stiffness.  LYMPHATICS: No enlarged nodes. No history of splenectomy.  PSYCHIATRIC: No history of depression or anxiety.  ENDOCRINOLOGIC: No reports of sweating, cold or heat intolerance. No polyuria or polydipsia.  Marland Kitchen   Physical Examination Today's Vitals   12/09/21 1146  BP: 110/64  Pulse: 64  SpO2: 96%  Weight: 221 lb (100.2 kg)  Height: 5' 7.5" (1.715 m)   Body mass index is 34.1 kg/m.  Gen: resting comfortably, no acute distress HEENT: no scleral icterus, pupils equal round and reactive, no palptable cervical adenopathy,  CV: RRR, no mr/g no jvd Resp: Clear to auscultation bilaterally GI: abdomen is soft, non-tender, non-distended, normal bowel sounds, no hepatosplenomegaly MSK: extremities are warm, no edema.  Skin: warm, no rash Neuro:  no focal deficits Psych: appropriate affect   Diagnostic Studies  11/2020 echo IMPRESSIONS     1. Left ventricular ejection fraction, by estimation, is 60 to 65%. The  left ventricle has normal function. The left ventricle has no regional  wall motion abnormalities. There is mild left ventricular hypertrophy.  Left ventricular diastolic parameters  are indeterminate.   2. Right ventricular systolic function was not well visualized. The right  ventricular size is not well visualized.   3. The mitral valve is normal in structure. No evidence of mitral valve  regurgitation. No evidence of mitral stenosis.   4. The aortic valve has an indeterminant number of cusps. Aortic valve  regurgitation is not visualized. No aortic stenosis is present.    Assessment and Plan  1.Afib/acquired thrombophilia - failed DCCV, he is doing well with just rate control. Continue diltiazem, continue eliquis for stroke prevention   2. Unprovoked DVT/PE - he will continue indefinitie anticoag   F/u 6 months  Arnoldo Lenis,  M.D.,

## 2021-12-09 NOTE — Patient Instructions (Addendum)
Medication Instructions:  Continue all current medications.   Labwork: none  Testing/Procedures: none  Follow-Up: 6 months   Any Other Special Instructions Will Be Listed Below (If Applicable).   If you need a refill on your cardiac medications before your next appointment, please call your pharmacy.  

## 2021-12-13 DIAGNOSIS — G4733 Obstructive sleep apnea (adult) (pediatric): Secondary | ICD-10-CM | POA: Diagnosis not present

## 2021-12-13 DIAGNOSIS — F419 Anxiety disorder, unspecified: Secondary | ICD-10-CM | POA: Diagnosis not present

## 2022-01-31 DIAGNOSIS — I480 Paroxysmal atrial fibrillation: Secondary | ICD-10-CM | POA: Diagnosis not present

## 2022-01-31 DIAGNOSIS — W57XXXA Bitten or stung by nonvenomous insect and other nonvenomous arthropods, initial encounter: Secondary | ICD-10-CM | POA: Diagnosis not present

## 2022-01-31 DIAGNOSIS — S39012A Strain of muscle, fascia and tendon of lower back, initial encounter: Secondary | ICD-10-CM | POA: Diagnosis not present

## 2022-01-31 DIAGNOSIS — E1169 Type 2 diabetes mellitus with other specified complication: Secondary | ICD-10-CM | POA: Diagnosis not present

## 2022-01-31 DIAGNOSIS — F411 Generalized anxiety disorder: Secondary | ICD-10-CM | POA: Diagnosis not present

## 2022-01-31 DIAGNOSIS — Z6834 Body mass index (BMI) 34.0-34.9, adult: Secondary | ICD-10-CM | POA: Diagnosis not present

## 2022-02-02 DIAGNOSIS — F419 Anxiety disorder, unspecified: Secondary | ICD-10-CM | POA: Diagnosis not present

## 2022-02-02 DIAGNOSIS — G4733 Obstructive sleep apnea (adult) (pediatric): Secondary | ICD-10-CM | POA: Diagnosis not present

## 2022-02-09 DIAGNOSIS — I4891 Unspecified atrial fibrillation: Secondary | ICD-10-CM | POA: Diagnosis not present

## 2022-02-09 DIAGNOSIS — E782 Mixed hyperlipidemia: Secondary | ICD-10-CM | POA: Diagnosis not present

## 2022-02-20 IMAGING — CT CT ANGIO CHEST
2 of 6 series · 18 of 46 positions shown · IV contrast (Omnipaque or Isovue)
Comparison: None.

CLINICAL DATA: Rule out pulmonary embolus.  DVT.

EXAM:
CT ANGIOGRAPHY CHEST WITH CONTRAST
TECHNIQUE: Multidetector CT imaging of the chest was performed using the
standard protocol during bolus administration of intravenous
contrast. Multiplanar CT image reconstructions and MIPs were
obtained to evaluate the vascular anatomy.
CONTRAST:  100mL OMNIPAQUE IOHEXOL 350 MG/ML SOLN

[Series 5: pe axial thins · axial · 0.84mm/px · z∈[+1077,+1345]mm · 15 of 367 slices shown]
[im 16/367  lung]
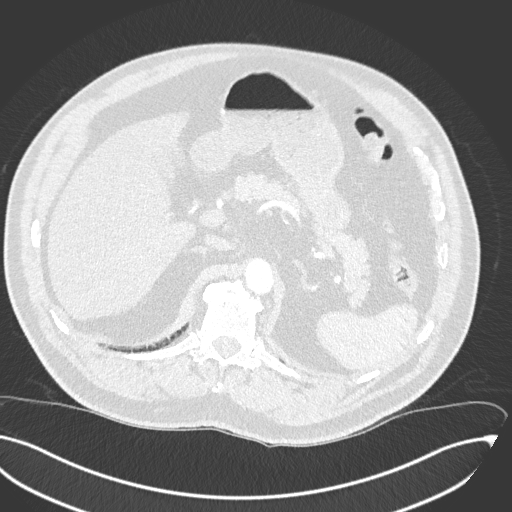
[im 46/367  soft-tissue]
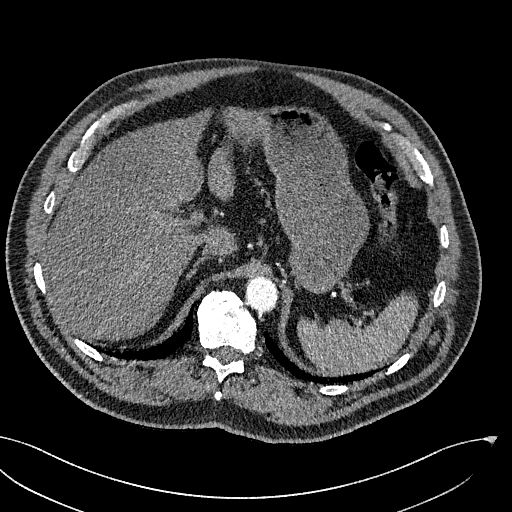
[im 62/367  lung]
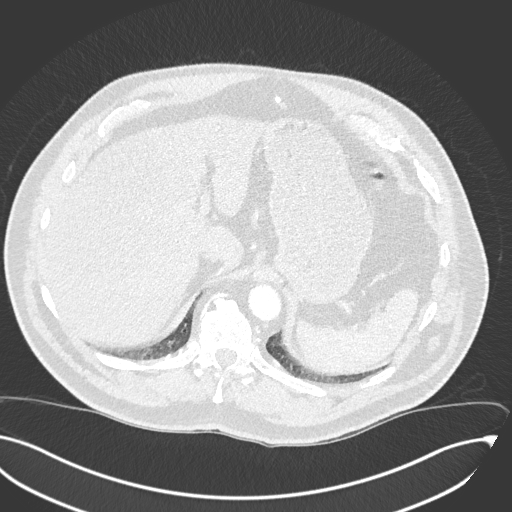
[im 92/367  soft-tissue]
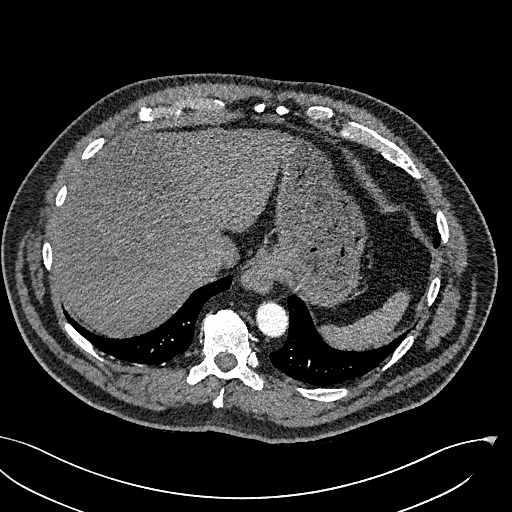
[im 107/367  lung]
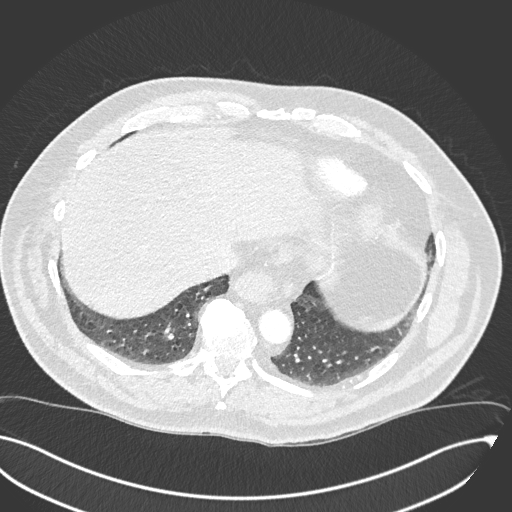
[im 138/367  soft-tissue]
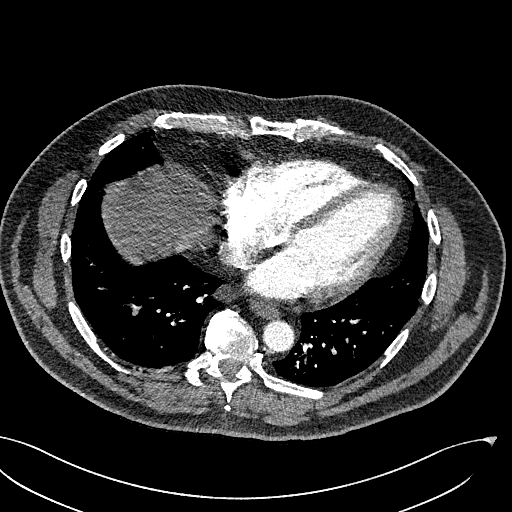
[im 153/367  lung]
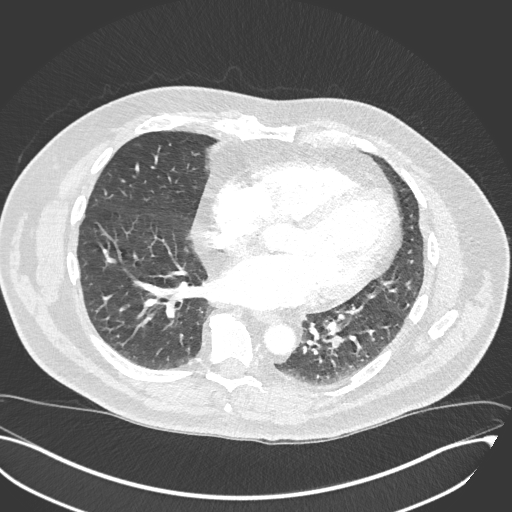
[im 184/367  soft-tissue]
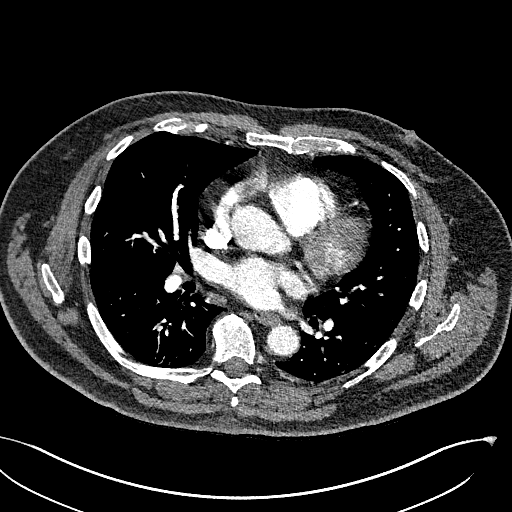
[im 214/367  lung]
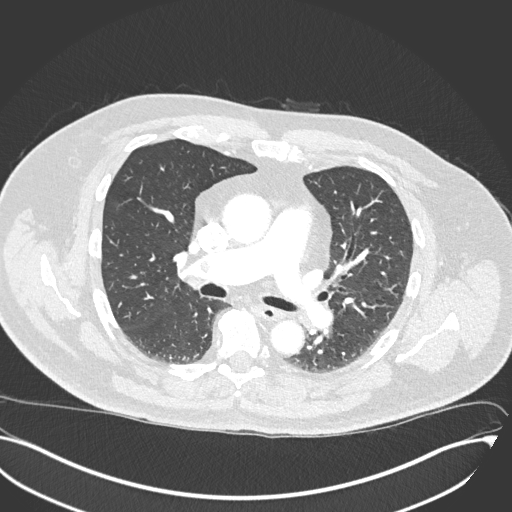
[im 229/367  soft-tissue]
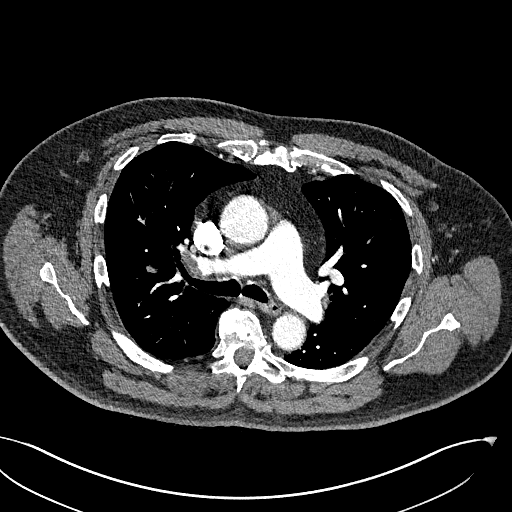
[im 260/367  lung]
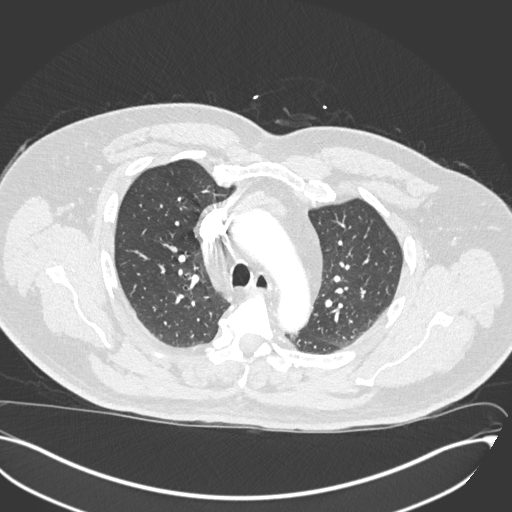
[im 275/367  soft-tissue]
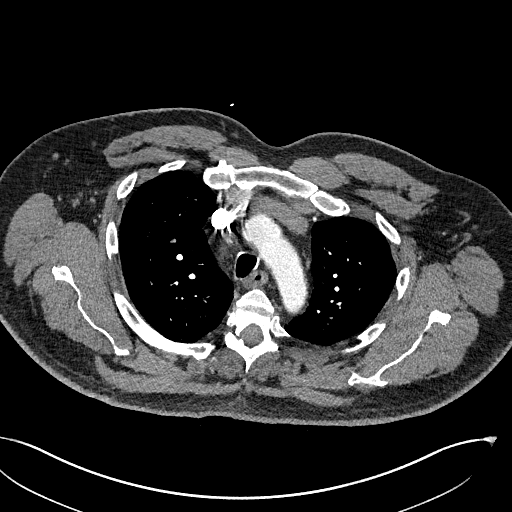
[im 306/367  lung]
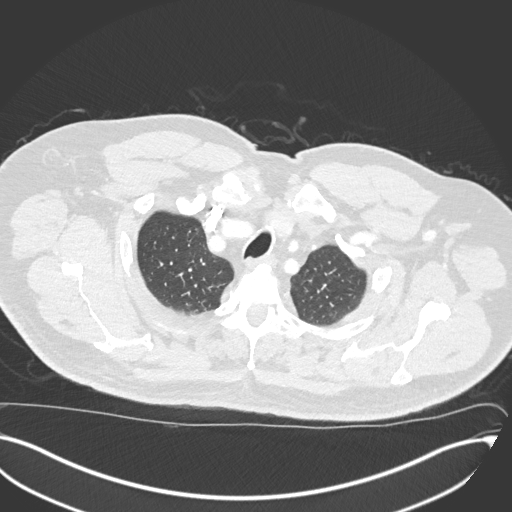
[im 321/367  soft-tissue]
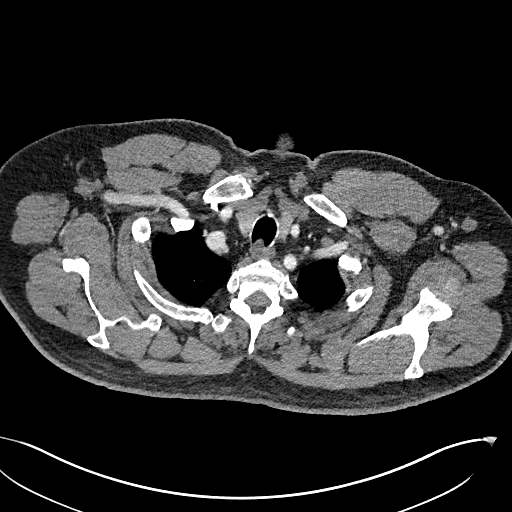
[im 351/367  lung]
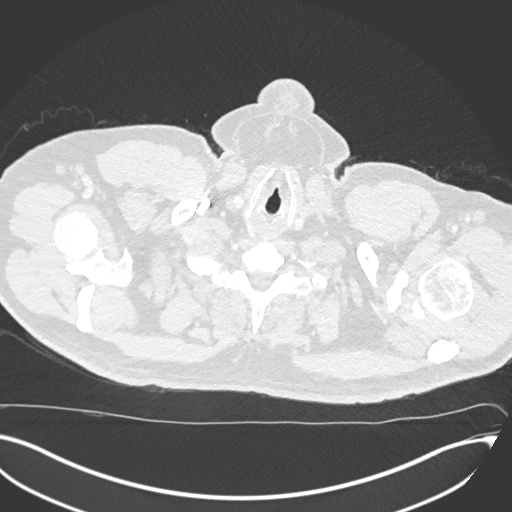

[Series 7: cor soft · coronal · 0.62mm/px · 3 of 181 slices shown]
[im 46/181  soft-tissue]
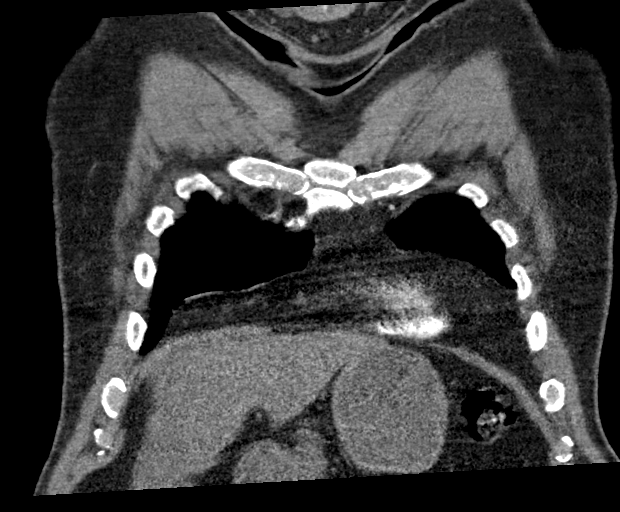
[im 91/181  soft-tissue]
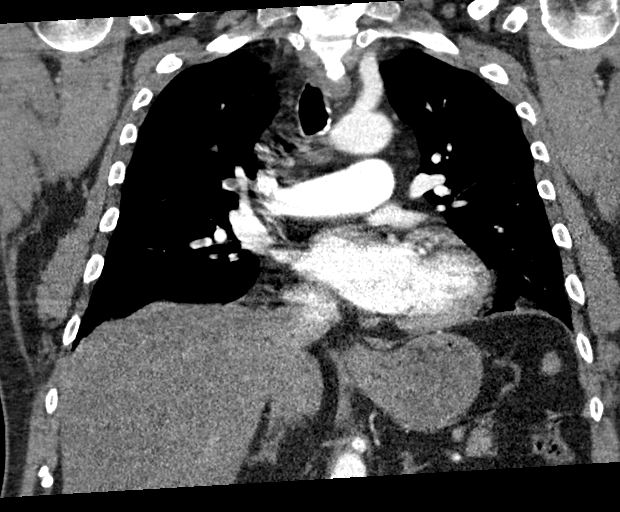
[im 136/181  soft-tissue]
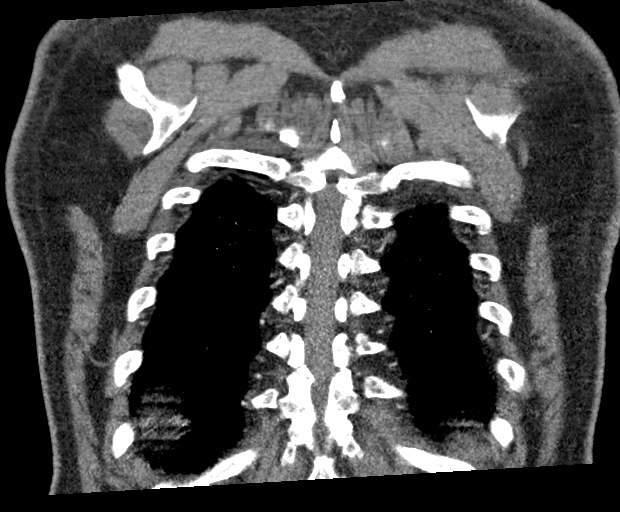

[18 of 46 positions shown; findings below may reference images not displayed]

FINDINGS: Cardiovascular: There is a clot within the distal right main
pulmonary artery which extends into the right upper lobe are and
segmental pulmonary arteries compatible with acute pulmonary
embolus. Segmental filling defects within the posterior left lower
lobe also noted compatible with acute pulmonary embolus, image
202/5. Filling defects within segmental branches of the right middle
lobe pulmonary artery and subsegmental branches of the right lower
lobe pulmonary artery are also noted.

The heart size appears normal.  The RV to LV ratio is equal to 1.03.

Mild aortic atherosclerosis and mild coronary artery calcifications.

Mediastinum/Nodes: No enlarged mediastinal, hilar, or axillary lymph
nodes. Thyroid gland, trachea, and esophagus demonstrate no
significant findings. Small hiatal hernia.

Lungs/Pleura: No pleural effusion. No airspace consolidation,
atelectasis, or pneumothorax.

Upper Abdomen: No acute abnormality within the imaged portions of
the upper abdomen.

Musculoskeletal: Degenerative disc disease identified. No acute or
suspicious findings.

Review of the MIP images confirms the above findings.
IMPRESSION: 1. Examination is positive for acute bilateral pulmonary emboli.
Positive for acute PE with of right heart strain (RV/LV Ratio 1.03)
consistent with at least submassive (intermediate risk) PE. The
presence of right heart strain has been associated with an increased
risk of morbidity and mortality.
2. Aortic atherosclerosis. Coronary artery calcifications.

Aortic Atherosclerosis ([id number]-3N1.1).

Critical Value/emergent results were called by telephone at the time
of interpretation on 11/24/2020 at [DATE] to provider MARIAXAVIER BENKENDORF , who
verbally acknowledged these results.

## 2022-03-02 DIAGNOSIS — E1169 Type 2 diabetes mellitus with other specified complication: Secondary | ICD-10-CM | POA: Diagnosis not present

## 2022-03-02 DIAGNOSIS — D519 Vitamin B12 deficiency anemia, unspecified: Secondary | ICD-10-CM | POA: Diagnosis not present

## 2022-03-02 DIAGNOSIS — D649 Anemia, unspecified: Secondary | ICD-10-CM | POA: Diagnosis not present

## 2022-03-02 DIAGNOSIS — Z0001 Encounter for general adult medical examination with abnormal findings: Secondary | ICD-10-CM | POA: Diagnosis not present

## 2022-03-02 DIAGNOSIS — E7800 Pure hypercholesterolemia, unspecified: Secondary | ICD-10-CM | POA: Diagnosis not present

## 2022-03-02 DIAGNOSIS — E7801 Familial hypercholesterolemia: Secondary | ICD-10-CM | POA: Diagnosis not present

## 2022-03-02 DIAGNOSIS — H40013 Open angle with borderline findings, low risk, bilateral: Secondary | ICD-10-CM | POA: Diagnosis not present

## 2022-03-05 DIAGNOSIS — F419 Anxiety disorder, unspecified: Secondary | ICD-10-CM | POA: Diagnosis not present

## 2022-03-05 DIAGNOSIS — G4733 Obstructive sleep apnea (adult) (pediatric): Secondary | ICD-10-CM | POA: Diagnosis not present

## 2022-03-08 ENCOUNTER — Other Ambulatory Visit (HOSPITAL_COMMUNITY): Payer: Self-pay | Admitting: Family Medicine

## 2022-03-08 ENCOUNTER — Other Ambulatory Visit: Payer: Self-pay | Admitting: Family Medicine

## 2022-03-08 DIAGNOSIS — G4733 Obstructive sleep apnea (adult) (pediatric): Secondary | ICD-10-CM | POA: Diagnosis not present

## 2022-03-08 DIAGNOSIS — R319 Hematuria, unspecified: Secondary | ICD-10-CM | POA: Diagnosis not present

## 2022-03-08 DIAGNOSIS — I480 Paroxysmal atrial fibrillation: Secondary | ICD-10-CM | POA: Diagnosis not present

## 2022-03-08 DIAGNOSIS — Z0001 Encounter for general adult medical examination with abnormal findings: Secondary | ICD-10-CM | POA: Diagnosis not present

## 2022-03-08 DIAGNOSIS — Z6834 Body mass index (BMI) 34.0-34.9, adult: Secondary | ICD-10-CM | POA: Diagnosis not present

## 2022-03-08 DIAGNOSIS — I2510 Atherosclerotic heart disease of native coronary artery without angina pectoris: Secondary | ICD-10-CM | POA: Diagnosis not present

## 2022-03-08 DIAGNOSIS — F411 Generalized anxiety disorder: Secondary | ICD-10-CM | POA: Diagnosis not present

## 2022-03-08 DIAGNOSIS — M170 Bilateral primary osteoarthritis of knee: Secondary | ICD-10-CM | POA: Diagnosis not present

## 2022-03-08 DIAGNOSIS — E1169 Type 2 diabetes mellitus with other specified complication: Secondary | ICD-10-CM | POA: Diagnosis not present

## 2022-03-08 DIAGNOSIS — R030 Elevated blood-pressure reading, without diagnosis of hypertension: Secondary | ICD-10-CM | POA: Diagnosis not present

## 2022-03-11 DIAGNOSIS — I4891 Unspecified atrial fibrillation: Secondary | ICD-10-CM | POA: Diagnosis not present

## 2022-03-11 DIAGNOSIS — E782 Mixed hyperlipidemia: Secondary | ICD-10-CM | POA: Diagnosis not present

## 2022-03-13 ENCOUNTER — Ambulatory Visit (HOSPITAL_COMMUNITY)
Admission: RE | Admit: 2022-03-13 | Discharge: 2022-03-13 | Disposition: A | Discharge status: Home / Self Care | Payer: PPO | Source: Ambulatory Visit | Attending: Family Medicine | Admitting: Family Medicine

## 2022-03-13 DIAGNOSIS — R319 Hematuria, unspecified: Secondary | ICD-10-CM | POA: Diagnosis not present

## 2022-03-13 DIAGNOSIS — K4020 Bilateral inguinal hernia, without obstruction or gangrene, not specified as recurrent: Secondary | ICD-10-CM | POA: Diagnosis not present

## 2022-03-13 DIAGNOSIS — K449 Diaphragmatic hernia without obstruction or gangrene: Secondary | ICD-10-CM | POA: Diagnosis not present

## 2022-03-13 DIAGNOSIS — R3129 Other microscopic hematuria: Secondary | ICD-10-CM | POA: Diagnosis not present

## 2022-03-13 DIAGNOSIS — K5730 Diverticulosis of large intestine without perforation or abscess without bleeding: Secondary | ICD-10-CM | POA: Diagnosis not present

## 2022-03-17 DIAGNOSIS — Z125 Encounter for screening for malignant neoplasm of prostate: Secondary | ICD-10-CM | POA: Diagnosis not present

## 2022-03-17 DIAGNOSIS — N400 Enlarged prostate without lower urinary tract symptoms: Secondary | ICD-10-CM | POA: Diagnosis not present

## 2022-03-22 DIAGNOSIS — R040 Epistaxis: Secondary | ICD-10-CM | POA: Diagnosis not present

## 2022-03-22 DIAGNOSIS — M25561 Pain in right knee: Secondary | ICD-10-CM | POA: Diagnosis not present

## 2022-03-22 DIAGNOSIS — Z6834 Body mass index (BMI) 34.0-34.9, adult: Secondary | ICD-10-CM | POA: Diagnosis not present

## 2022-03-22 DIAGNOSIS — R030 Elevated blood-pressure reading, without diagnosis of hypertension: Secondary | ICD-10-CM | POA: Diagnosis not present

## 2022-04-04 DIAGNOSIS — F419 Anxiety disorder, unspecified: Secondary | ICD-10-CM | POA: Diagnosis not present

## 2022-04-04 DIAGNOSIS — G4733 Obstructive sleep apnea (adult) (pediatric): Secondary | ICD-10-CM | POA: Diagnosis not present

## 2022-04-10 DIAGNOSIS — R1031 Right lower quadrant pain: Secondary | ICD-10-CM | POA: Diagnosis not present

## 2022-04-10 DIAGNOSIS — M160 Bilateral primary osteoarthritis of hip: Secondary | ICD-10-CM | POA: Diagnosis not present

## 2022-04-10 DIAGNOSIS — R1032 Left lower quadrant pain: Secondary | ICD-10-CM | POA: Diagnosis not present

## 2022-04-10 DIAGNOSIS — Z6834 Body mass index (BMI) 34.0-34.9, adult: Secondary | ICD-10-CM | POA: Diagnosis not present

## 2022-04-10 DIAGNOSIS — R030 Elevated blood-pressure reading, without diagnosis of hypertension: Secondary | ICD-10-CM | POA: Diagnosis not present

## 2022-04-21 DIAGNOSIS — Z6834 Body mass index (BMI) 34.0-34.9, adult: Secondary | ICD-10-CM | POA: Diagnosis not present

## 2022-04-21 DIAGNOSIS — M1611 Unilateral primary osteoarthritis, right hip: Secondary | ICD-10-CM | POA: Diagnosis not present

## 2022-04-21 DIAGNOSIS — R030 Elevated blood-pressure reading, without diagnosis of hypertension: Secondary | ICD-10-CM | POA: Diagnosis not present

## 2022-04-21 DIAGNOSIS — R1032 Left lower quadrant pain: Secondary | ICD-10-CM | POA: Diagnosis not present

## 2022-04-21 DIAGNOSIS — R1031 Right lower quadrant pain: Secondary | ICD-10-CM | POA: Diagnosis not present

## 2022-04-21 DIAGNOSIS — M1612 Unilateral primary osteoarthritis, left hip: Secondary | ICD-10-CM | POA: Diagnosis not present

## 2022-04-25 DIAGNOSIS — M160 Bilateral primary osteoarthritis of hip: Secondary | ICD-10-CM | POA: Diagnosis not present

## 2022-05-01 DIAGNOSIS — M160 Bilateral primary osteoarthritis of hip: Secondary | ICD-10-CM | POA: Diagnosis not present

## 2022-05-05 DIAGNOSIS — G4733 Obstructive sleep apnea (adult) (pediatric): Secondary | ICD-10-CM | POA: Diagnosis not present

## 2022-05-05 DIAGNOSIS — F419 Anxiety disorder, unspecified: Secondary | ICD-10-CM | POA: Diagnosis not present

## 2022-05-10 DIAGNOSIS — M160 Bilateral primary osteoarthritis of hip: Secondary | ICD-10-CM | POA: Diagnosis not present

## 2022-05-12 DIAGNOSIS — M160 Bilateral primary osteoarthritis of hip: Secondary | ICD-10-CM | POA: Diagnosis not present

## 2022-05-15 DIAGNOSIS — M160 Bilateral primary osteoarthritis of hip: Secondary | ICD-10-CM | POA: Diagnosis not present

## 2022-05-17 DIAGNOSIS — M160 Bilateral primary osteoarthritis of hip: Secondary | ICD-10-CM | POA: Diagnosis not present

## 2022-05-18 DIAGNOSIS — H43813 Vitreous degeneration, bilateral: Secondary | ICD-10-CM | POA: Diagnosis not present

## 2022-05-22 DIAGNOSIS — M160 Bilateral primary osteoarthritis of hip: Secondary | ICD-10-CM | POA: Diagnosis not present

## 2022-05-25 DIAGNOSIS — M160 Bilateral primary osteoarthritis of hip: Secondary | ICD-10-CM | POA: Diagnosis not present

## 2022-05-26 DIAGNOSIS — R739 Hyperglycemia, unspecified: Secondary | ICD-10-CM | POA: Diagnosis not present

## 2022-05-26 DIAGNOSIS — R5383 Other fatigue: Secondary | ICD-10-CM | POA: Diagnosis not present

## 2022-05-26 DIAGNOSIS — E1169 Type 2 diabetes mellitus with other specified complication: Secondary | ICD-10-CM | POA: Diagnosis not present

## 2022-06-01 DIAGNOSIS — R1031 Right lower quadrant pain: Secondary | ICD-10-CM | POA: Diagnosis not present

## 2022-06-01 DIAGNOSIS — M160 Bilateral primary osteoarthritis of hip: Secondary | ICD-10-CM | POA: Diagnosis not present

## 2022-06-01 DIAGNOSIS — Z6834 Body mass index (BMI) 34.0-34.9, adult: Secondary | ICD-10-CM | POA: Diagnosis not present

## 2022-06-01 DIAGNOSIS — E1169 Type 2 diabetes mellitus with other specified complication: Secondary | ICD-10-CM | POA: Diagnosis not present

## 2022-06-01 DIAGNOSIS — F411 Generalized anxiety disorder: Secondary | ICD-10-CM | POA: Diagnosis not present

## 2022-06-01 DIAGNOSIS — R030 Elevated blood-pressure reading, without diagnosis of hypertension: Secondary | ICD-10-CM | POA: Diagnosis not present

## 2022-06-01 DIAGNOSIS — G4733 Obstructive sleep apnea (adult) (pediatric): Secondary | ICD-10-CM | POA: Diagnosis not present

## 2022-06-01 DIAGNOSIS — I480 Paroxysmal atrial fibrillation: Secondary | ICD-10-CM | POA: Diagnosis not present

## 2022-06-04 DIAGNOSIS — F419 Anxiety disorder, unspecified: Secondary | ICD-10-CM | POA: Diagnosis not present

## 2022-06-04 DIAGNOSIS — G4733 Obstructive sleep apnea (adult) (pediatric): Secondary | ICD-10-CM | POA: Diagnosis not present

## 2022-06-07 DIAGNOSIS — M160 Bilateral primary osteoarthritis of hip: Secondary | ICD-10-CM | POA: Diagnosis not present

## 2022-06-08 DIAGNOSIS — M160 Bilateral primary osteoarthritis of hip: Secondary | ICD-10-CM | POA: Diagnosis not present

## 2022-06-09 ENCOUNTER — Encounter: Payer: Self-pay | Admitting: Cardiology

## 2022-06-09 ENCOUNTER — Ambulatory Visit: Payer: PPO | Attending: Cardiology | Admitting: Cardiology

## 2022-06-09 VITALS — BP 118/64 | HR 73 | Ht 67.5 in | Wt 222.4 lb

## 2022-06-09 DIAGNOSIS — I480 Paroxysmal atrial fibrillation: Secondary | ICD-10-CM

## 2022-06-09 DIAGNOSIS — E782 Mixed hyperlipidemia: Secondary | ICD-10-CM

## 2022-06-09 NOTE — Progress Notes (Signed)
Clinical Summary Brian Leach is a 71 y.o.male seen today for follow up of the following medical problems.    AFib/PAF -new diagnosis during 11/2020 admission with DVT/PE - self converted during that admission, started on diltiazem - recurrent afib as outpatient that was persistent, referred for DCCV - 04/2021 DCCV succesful but right back into afib minutes later     - no recent palpitations - he is compliant with diltiazem - no bleeding on eliquis     2.History of unprovoked DVT/PE - he is on eliquis       3. Hyperlipidemia - he is on crestor  11/2021 TC 131 TG 60 HDL 59 LDL 59   4. OSA - followed by Dr Halford Chessman Past Medical History:  Diagnosis Date   Anxiety    Arthritis    BPH (benign prostatic hyperplasia)    Complication of anesthesia    difficult intubation   Diabetes mellitus without complication (HCC)    Dysrhythmia    GERD (gastroesophageal reflux disease)    History of gout    Hyperlipidemia    Left leg DVT ([place]) 11/2020   Paroxysmal atrial fibrillation (HCC)    Pulmonary embolism ([place]) 11/2020   Seasonal allergies    Sleep apnea      Allergies  Allergen Reactions   Codeine Rash     Current Outpatient Medications  Medication Sig Dispense Refill   acetaminophen (TYLENOL) 500 MG tablet Take 500-1,000 mg by mouth every 8 (eight) hours as needed for moderate pain.     apixaban (ELIQUIS) 5 MG TABS tablet Take 5 mg by mouth 2 (two) times daily.     azelastine (ASTELIN) 0.1 % nasal spray Place 1 spray into both nostrils 2 (two) times daily as needed for rhinitis. Use in each nostril as directed     diltiazem (CARDIZEM CD) 180 MG 24 hr capsule Take 180 mg by mouth daily.     ketoconazole (NIZORAL) 2 % cream Apply 1 Application topically daily.     loratadine (CLARITIN) 10 MG tablet Take 10 mg by mouth daily as needed for allergies.     rosuvastatin (CRESTOR) 10 MG tablet Take 1 tablet (10 mg total) by mouth daily at 6 PM. 30 tablet 1   sertraline  (ZOLOFT) 50 MG tablet Take 50 mg by mouth daily.     No current facility-administered medications for this visit.     Past Surgical History:  Procedure Laterality Date   CARDIOVERSION N/A 04/27/2021   Procedure: CARDIOVERSION;  Surgeon: Satira Sark, MD;  Location: [facility];  Service: Cardiovascular;  Laterality: N/A;   COLONOSCOPY N/A 04/07/2013   Procedure: COLONOSCOPY;  Surgeon: Daneil Dolin, MD;  [facility];  Service: Endoscopy;  Laterality: N/A;  7:30 AM   COLONOSCOPY N/A 05/14/2018   Procedure: COLONOSCOPY;  Surgeon: Daneil Dolin, MD;  [facility];  Service: Endoscopy;  Laterality: N/A;  10:30   COLONOSCOPY WITH PROPOFOL N/A 10/27/2021   Procedure: COLONOSCOPY WITH PROPOFOL;  Surgeon: Daneil Dolin, MD;  [facility];  Service: Endoscopy;  Laterality: N/A;  1:00pm   KNEE ARTHROSCOPY Left    POLYPECTOMY  05/14/2018   Procedure: POLYPECTOMY;  Surgeon: Daneil Dolin, MD;  [facility];  Service: Endoscopy;;  colon    POLYPECTOMY  10/27/2021   Procedure: POLYPECTOMY;  Surgeon: Daneil Dolin, MD;  [facility];  Service: Endoscopy;;     Allergies  Allergen Reactions  Codeine Rash      Family History  Problem Relation Age of Onset   Heart disease Brother      Social History Brian Leach reports that he has never smoked. He has never used smokeless tobacco. Brian Leach reports no history of alcohol use.   Review of Systems CONSTITUTIONAL: No weight loss, fever, chills, weakness or fatigue.  HEENT: Eyes: No visual loss, blurred vision, double vision or yellow sclerae.No hearing loss, sneezing, congestion, runny nose or sore throat.  SKIN: No rash or itching.  CARDIOVASCULAR: per hpi RESPIRATORY: No shortness of breath, cough or sputum.  GASTROINTESTINAL: No anorexia, nausea, vomiting or diarrhea. No abdominal pain or blood.  GENITOURINARY: No burning on urination, no polyuria NEUROLOGICAL: No  headache, dizziness, syncope, paralysis, ataxia, numbness or tingling in the extremities. No change in bowel or bladder control.  MUSCULOSKELETAL: No muscle, back pain, joint pain or stiffness.  LYMPHATICS: No enlarged nodes. No history of splenectomy.  PSYCHIATRIC: No history of depression or anxiety.  ENDOCRINOLOGIC: No reports of sweating, cold or heat intolerance. No polyuria or polydipsia.  Marland Kitchen   Physical Examination Today's Vitals   06/09/22 1134  BP: 118/64  Pulse: 73  SpO2: 94%  Weight: 222 lb 6.4 oz (100.9 kg)  Height: 5' 7.5" (1.715 m)   Body mass index is 34.32 kg/m.  Gen: resting comfortably, no acute distress HEENT: no scleral icterus, pupils equal round and reactive, no palptable cervical adenopathy,  CV: RRR, no m/r/g no jvd Resp: Clear to auscultation bilaterally GI: abdomen is soft, non-tender, non-distended, normal bowel sounds, no hepatosplenomegaly MSK: extremities are warm, no edema.  Skin: warm, no rash Neuro:  no focal deficits Psych: appropriate affect   Diagnostic Studies 11/2020 echo IMPRESSIONS     1. Left ventricular ejection fraction, by estimation, is 60 to 65%. The  left ventricle has normal function. The left ventricle has no regional  wall motion abnormalities. There is mild left ventricular hypertrophy.  Left ventricular diastolic parameters  are indeterminate.   2. Right ventricular systolic function was not well visualized. The right  ventricular size is not well visualized.   3. The mitral valve is normal in structure. No evidence of mitral valve  regurgitation. No evidence of mitral stenosis.   4. The aortic valve has an indeterminant number of cusps. Aortic valve  regurgitation is not visualized. No aortic stenosis is present.    Assessment and Plan  1.Afib/acquired thrombophilia - failed DCCV, he is doing well with just rate control.  EKG today actually shows he is back in SR on his own today - continue current meds, eliquis  for stroke prevention   2. Hyperlipidemia - LDL at goal, continue current meds      Arnoldo Lenis, M.D.

## 2022-06-09 NOTE — Patient Instructions (Addendum)

## 2022-06-11 DIAGNOSIS — I4891 Unspecified atrial fibrillation: Secondary | ICD-10-CM | POA: Diagnosis not present

## 2022-06-11 DIAGNOSIS — E782 Mixed hyperlipidemia: Secondary | ICD-10-CM | POA: Diagnosis not present

## 2022-06-12 DIAGNOSIS — F419 Anxiety disorder, unspecified: Secondary | ICD-10-CM | POA: Diagnosis not present

## 2022-06-12 DIAGNOSIS — G4733 Obstructive sleep apnea (adult) (pediatric): Secondary | ICD-10-CM | POA: Diagnosis not present

## 2022-06-14 DIAGNOSIS — M160 Bilateral primary osteoarthritis of hip: Secondary | ICD-10-CM | POA: Diagnosis not present

## 2022-06-21 DIAGNOSIS — M160 Bilateral primary osteoarthritis of hip: Secondary | ICD-10-CM | POA: Diagnosis not present

## 2022-06-28 DIAGNOSIS — M160 Bilateral primary osteoarthritis of hip: Secondary | ICD-10-CM | POA: Diagnosis not present

## 2022-06-29 DIAGNOSIS — H43812 Vitreous degeneration, left eye: Secondary | ICD-10-CM | POA: Diagnosis not present

## 2022-07-05 DIAGNOSIS — G4733 Obstructive sleep apnea (adult) (pediatric): Secondary | ICD-10-CM | POA: Diagnosis not present

## 2022-07-05 DIAGNOSIS — F419 Anxiety disorder, unspecified: Secondary | ICD-10-CM | POA: Diagnosis not present

## 2022-07-05 DIAGNOSIS — M160 Bilateral primary osteoarthritis of hip: Secondary | ICD-10-CM | POA: Diagnosis not present

## 2022-07-13 DIAGNOSIS — M160 Bilateral primary osteoarthritis of hip: Secondary | ICD-10-CM | POA: Diagnosis not present

## 2022-07-19 DIAGNOSIS — M160 Bilateral primary osteoarthritis of hip: Secondary | ICD-10-CM | POA: Diagnosis not present

## 2022-07-26 DIAGNOSIS — M160 Bilateral primary osteoarthritis of hip: Secondary | ICD-10-CM | POA: Diagnosis not present

## 2022-08-02 ENCOUNTER — Encounter: Payer: Self-pay | Admitting: Pulmonary Disease

## 2022-08-02 ENCOUNTER — Ambulatory Visit (INDEPENDENT_AMBULATORY_CARE_PROVIDER_SITE_OTHER): Payer: PPO | Admitting: Pulmonary Disease

## 2022-08-02 VITALS — BP 126/74 | HR 80 | Ht 67.5 in | Wt 220.6 lb

## 2022-08-02 DIAGNOSIS — G4733 Obstructive sleep apnea (adult) (pediatric): Secondary | ICD-10-CM | POA: Diagnosis not present

## 2022-08-02 DIAGNOSIS — J310 Chronic rhinitis: Secondary | ICD-10-CM | POA: Diagnosis not present

## 2022-08-02 DIAGNOSIS — M25512 Pain in left shoulder: Secondary | ICD-10-CM

## 2022-08-02 NOTE — Progress Notes (Signed)
[facility]  Chief Complaint  Patient presents with   Follow-up    CPAP working well     Past Surgical History:  He  has a past surgical history that includes Knee arthroscopy (Left); Colonoscopy (N/A, 04/07/2013); Colonoscopy (N/A, 05/14/2018); polypectomy (05/14/2018); Cardioversion (N/A, 04/27/2021); Colonoscopy with propofol (N/A, 10/27/2021); and polypectomy (10/27/2021).  Past Medical History:  Anxiety, OA, GERD, Gout, Allergies, PE with Lt leg DVT June 2022, PAF, BPH, HLD  Constitutional:  BP 126/74   Pulse 80   Ht 5' 7.5" (1.715 m)   Wt 220 lb 9.6 oz (100.1 kg)   SpO2 97% Comment: ra  BMI 34.04 kg/m   Brief Summary:  Brian Leach is a 72 y.o. male with obstructive sleep apnea.      Subjective:   He is here with his wife.  He has been doing better with CPAP.  No issues with mask fit.  He sleeps for a couple hours per day sitting up in his chair.  This is a better position for him when his arthritis flares up.  He forgot to check with his dentist about an oral appliance.  He has noticed discomfort at his Lt [facility] -  joint area for the past couple of days.  This started after he was shoveling dirt.   Physical Exam:   Appearance - well kempt   ENMT - no sinus tenderness, no oral exudate, no LAN, Mallampati 4 airway, no stridor  Respiratory - equal breath sounds bilaterally, no wheezing or rales  CV - s1s2 regular rate and rhythm, no murmurs  Ext - no clubbing, no edema  Skin - no rashes  Psych - normal mood and affect    Chest Imaging:  CT angio chest 11/24/20 >> b/l PE, atherosclerosis  Sleep Tests:  PSG 08/14/21 >> AHI 9.7, SpO2 low 90%. Auto CPAP 07/02/22 to 07/31/22 >> used on 28 of 30 nights with average 4 hrs 53 min.  Average AHI 0.5 with CPAP 6 cm H2O  Cardiac Tests:  Doppler legs b/l 11/24/20 >> DVT Lt poplietal and distal femoral veins Echo 11/25/20 >> EF 60 to 65%, mild LVH  Social History:  He  reports  that he has never smoked. He has never used smokeless tobacco. He reports that he does not drink alcohol and does not use drugs.  Family History:  His family history includes Heart disease in his brother.     Assessment/Plan:   Obstructive sleep apnea. - he is compliant with CPAP and reports benefit from therapy - he uses [employer] for his DME - order for current CPAP was placed March 2023 - continue CPAP 6 cm H2O - will change his CPAP to 6 cm H2O  CPAP rhinitis. - prn azelastine - he does not want to use any steroid medications  Paroxysmal atrial fibrillation, Unprovoked Pulmonary embolism. - followed by Dr. Carlyle Dolly with [facility]  Pain in [facility] joint. - likely from over use related to shoveling - he can use prn acetaminophen for pain relief - follow up with PCP if this persists  Time Spent Involved in Patient Care on Day of Examination:  25 minutes  Follow up:   Patient Instructions  Call if you need an order to get new CPAP supplies  Follow up in 1 year  Medication List:   Allergies as of 08/02/2022       Reactions   Codeine Rash        Medication  List        Accurate as of August 02, 2022  9:53 AM. If you have any questions, ask your nurse or doctor.          acetaminophen 500 MG tablet Commonly known as: TYLENOL Take 500-1,000 mg by mouth every 8 (eight) hours as needed for moderate pain.   apixaban 5 MG Tabs tablet Commonly known as: ELIQUIS Take 5 mg by mouth 2 (two) times daily.   azelastine 0.1 % nasal spray Commonly known as: ASTELIN Place 1 spray into both nostrils 2 (two) times daily as needed for rhinitis. Use in each nostril as directed   diltiazem 180 MG 24 hr capsule Commonly known as: CARDIZEM CD Take 180 mg by mouth daily.   ketoconazole 2 % cream Commonly known as: NIZORAL Apply 1 Application topically daily.   loratadine 10 MG tablet Commonly known as: CLARITIN Take 10 mg by mouth daily as needed for  allergies.   rosuvastatin 10 MG tablet Commonly known as: CRESTOR Take 1 tablet (10 mg total) by mouth daily at 6 PM.   sertraline 50 MG tablet Commonly known as: ZOLOFT Take 75 mg by mouth daily.        Signature:  Chesley Mires, MD [place] Pager - [phone] 08/02/2022, 9:53 AM

## 2022-08-02 NOTE — Patient Instructions (Signed)
Call if you need an order to get new CPAP supplies  Follow up in 1 year

## 2022-08-03 DIAGNOSIS — M160 Bilateral primary osteoarthritis of hip: Secondary | ICD-10-CM | POA: Diagnosis not present

## 2022-08-05 DIAGNOSIS — F419 Anxiety disorder, unspecified: Secondary | ICD-10-CM | POA: Diagnosis not present

## 2022-08-05 DIAGNOSIS — G4733 Obstructive sleep apnea (adult) (pediatric): Secondary | ICD-10-CM | POA: Diagnosis not present

## 2022-08-09 DIAGNOSIS — M160 Bilateral primary osteoarthritis of hip: Secondary | ICD-10-CM | POA: Diagnosis not present

## 2022-08-14 DIAGNOSIS — G4733 Obstructive sleep apnea (adult) (pediatric): Secondary | ICD-10-CM | POA: Diagnosis not present

## 2022-08-16 DIAGNOSIS — M160 Bilateral primary osteoarthritis of hip: Secondary | ICD-10-CM | POA: Diagnosis not present

## 2022-08-23 DIAGNOSIS — M160 Bilateral primary osteoarthritis of hip: Secondary | ICD-10-CM | POA: Diagnosis not present

## 2022-08-25 DIAGNOSIS — D649 Anemia, unspecified: Secondary | ICD-10-CM | POA: Diagnosis not present

## 2022-08-25 DIAGNOSIS — E7801 Familial hypercholesterolemia: Secondary | ICD-10-CM | POA: Diagnosis not present

## 2022-08-25 DIAGNOSIS — D519 Vitamin B12 deficiency anemia, unspecified: Secondary | ICD-10-CM | POA: Diagnosis not present

## 2022-08-25 DIAGNOSIS — E1169 Type 2 diabetes mellitus with other specified complication: Secondary | ICD-10-CM | POA: Diagnosis not present

## 2022-08-25 DIAGNOSIS — Z1329 Encounter for screening for other suspected endocrine disorder: Secondary | ICD-10-CM | POA: Diagnosis not present

## 2022-08-30 DIAGNOSIS — M160 Bilateral primary osteoarthritis of hip: Secondary | ICD-10-CM | POA: Diagnosis not present

## 2022-08-30 DIAGNOSIS — F411 Generalized anxiety disorder: Secondary | ICD-10-CM | POA: Diagnosis not present

## 2022-08-30 DIAGNOSIS — G4733 Obstructive sleep apnea (adult) (pediatric): Secondary | ICD-10-CM | POA: Diagnosis not present

## 2022-08-30 DIAGNOSIS — Z6835 Body mass index (BMI) 35.0-35.9, adult: Secondary | ICD-10-CM | POA: Diagnosis not present

## 2022-08-30 DIAGNOSIS — R1031 Right lower quadrant pain: Secondary | ICD-10-CM | POA: Diagnosis not present

## 2022-08-30 DIAGNOSIS — Z1389 Encounter for screening for other disorder: Secondary | ICD-10-CM | POA: Diagnosis not present

## 2022-08-30 DIAGNOSIS — Z1331 Encounter for screening for depression: Secondary | ICD-10-CM | POA: Diagnosis not present

## 2022-08-30 DIAGNOSIS — I480 Paroxysmal atrial fibrillation: Secondary | ICD-10-CM | POA: Diagnosis not present

## 2022-08-30 DIAGNOSIS — R030 Elevated blood-pressure reading, without diagnosis of hypertension: Secondary | ICD-10-CM | POA: Diagnosis not present

## 2022-08-30 DIAGNOSIS — E1169 Type 2 diabetes mellitus with other specified complication: Secondary | ICD-10-CM | POA: Diagnosis not present

## 2022-08-31 DIAGNOSIS — M160 Bilateral primary osteoarthritis of hip: Secondary | ICD-10-CM | POA: Diagnosis not present

## 2022-09-01 DIAGNOSIS — S80861A Insect bite (nonvenomous), right lower leg, initial encounter: Secondary | ICD-10-CM | POA: Diagnosis not present

## 2022-09-01 DIAGNOSIS — R030 Elevated blood-pressure reading, without diagnosis of hypertension: Secondary | ICD-10-CM | POA: Diagnosis not present

## 2022-09-01 DIAGNOSIS — Z6835 Body mass index (BMI) 35.0-35.9, adult: Secondary | ICD-10-CM | POA: Diagnosis not present

## 2022-09-03 DIAGNOSIS — G4733 Obstructive sleep apnea (adult) (pediatric): Secondary | ICD-10-CM | POA: Diagnosis not present

## 2022-09-03 DIAGNOSIS — F419 Anxiety disorder, unspecified: Secondary | ICD-10-CM | POA: Diagnosis not present

## 2022-09-06 DIAGNOSIS — R059 Cough, unspecified: Secondary | ICD-10-CM | POA: Diagnosis not present

## 2022-09-06 DIAGNOSIS — Z20828 Contact with and (suspected) exposure to other viral communicable diseases: Secondary | ICD-10-CM | POA: Diagnosis not present

## 2022-09-06 DIAGNOSIS — Z6835 Body mass index (BMI) 35.0-35.9, adult: Secondary | ICD-10-CM | POA: Diagnosis not present

## 2022-09-06 DIAGNOSIS — R030 Elevated blood-pressure reading, without diagnosis of hypertension: Secondary | ICD-10-CM | POA: Diagnosis not present

## 2022-09-18 DIAGNOSIS — Z6835 Body mass index (BMI) 35.0-35.9, adult: Secondary | ICD-10-CM | POA: Diagnosis not present

## 2022-09-18 DIAGNOSIS — J019 Acute sinusitis, unspecified: Secondary | ICD-10-CM | POA: Diagnosis not present

## 2022-09-18 DIAGNOSIS — R059 Cough, unspecified: Secondary | ICD-10-CM | POA: Diagnosis not present

## 2022-09-18 DIAGNOSIS — R030 Elevated blood-pressure reading, without diagnosis of hypertension: Secondary | ICD-10-CM | POA: Diagnosis not present

## 2022-10-03 DIAGNOSIS — F419 Anxiety disorder, unspecified: Secondary | ICD-10-CM | POA: Diagnosis not present

## 2022-10-03 DIAGNOSIS — G4733 Obstructive sleep apnea (adult) (pediatric): Secondary | ICD-10-CM | POA: Diagnosis not present

## 2023-01-04 DIAGNOSIS — R350 Frequency of micturition: Secondary | ICD-10-CM | POA: Diagnosis not present

## 2023-01-04 DIAGNOSIS — R030 Elevated blood-pressure reading, without diagnosis of hypertension: Secondary | ICD-10-CM | POA: Diagnosis not present

## 2023-01-04 DIAGNOSIS — Z6834 Body mass index (BMI) 34.0-34.9, adult: Secondary | ICD-10-CM | POA: Diagnosis not present

## 2023-01-04 DIAGNOSIS — E1165 Type 2 diabetes mellitus with hyperglycemia: Secondary | ICD-10-CM | POA: Diagnosis not present

## 2023-03-06 DIAGNOSIS — E7801 Familial hypercholesterolemia: Secondary | ICD-10-CM | POA: Diagnosis not present

## 2023-03-06 DIAGNOSIS — E1165 Type 2 diabetes mellitus with hyperglycemia: Secondary | ICD-10-CM | POA: Diagnosis not present

## 2023-03-06 DIAGNOSIS — Z125 Encounter for screening for malignant neoplasm of prostate: Secondary | ICD-10-CM | POA: Diagnosis not present

## 2023-03-06 DIAGNOSIS — E7800 Pure hypercholesterolemia, unspecified: Secondary | ICD-10-CM | POA: Diagnosis not present

## 2023-03-06 DIAGNOSIS — Z1329 Encounter for screening for other suspected endocrine disorder: Secondary | ICD-10-CM | POA: Diagnosis not present

## 2023-03-06 DIAGNOSIS — E1169 Type 2 diabetes mellitus with other specified complication: Secondary | ICD-10-CM | POA: Diagnosis not present

## 2023-03-06 DIAGNOSIS — Z0001 Encounter for general adult medical examination with abnormal findings: Secondary | ICD-10-CM | POA: Diagnosis not present

## 2023-03-06 DIAGNOSIS — R739 Hyperglycemia, unspecified: Secondary | ICD-10-CM | POA: Diagnosis not present

## 2023-03-06 DIAGNOSIS — D519 Vitamin B12 deficiency anemia, unspecified: Secondary | ICD-10-CM | POA: Diagnosis not present

## 2023-03-13 DIAGNOSIS — I480 Paroxysmal atrial fibrillation: Secondary | ICD-10-CM | POA: Diagnosis not present

## 2023-03-13 DIAGNOSIS — F411 Generalized anxiety disorder: Secondary | ICD-10-CM | POA: Diagnosis not present

## 2023-03-13 DIAGNOSIS — Z0001 Encounter for general adult medical examination with abnormal findings: Secondary | ICD-10-CM | POA: Diagnosis not present

## 2023-03-13 DIAGNOSIS — R030 Elevated blood-pressure reading, without diagnosis of hypertension: Secondary | ICD-10-CM | POA: Diagnosis not present

## 2023-03-13 DIAGNOSIS — Z23 Encounter for immunization: Secondary | ICD-10-CM | POA: Diagnosis not present

## 2023-03-13 DIAGNOSIS — Z6835 Body mass index (BMI) 35.0-35.9, adult: Secondary | ICD-10-CM | POA: Diagnosis not present

## 2023-03-13 DIAGNOSIS — I2510 Atherosclerotic heart disease of native coronary artery without angina pectoris: Secondary | ICD-10-CM | POA: Diagnosis not present

## 2023-03-13 DIAGNOSIS — M160 Bilateral primary osteoarthritis of hip: Secondary | ICD-10-CM | POA: Diagnosis not present

## 2023-03-13 DIAGNOSIS — J019 Acute sinusitis, unspecified: Secondary | ICD-10-CM | POA: Diagnosis not present

## 2023-03-13 DIAGNOSIS — G4733 Obstructive sleep apnea (adult) (pediatric): Secondary | ICD-10-CM | POA: Diagnosis not present

## 2023-03-13 DIAGNOSIS — E1169 Type 2 diabetes mellitus with other specified complication: Secondary | ICD-10-CM | POA: Diagnosis not present

## 2023-03-16 DIAGNOSIS — H905 Unspecified sensorineural hearing loss: Secondary | ICD-10-CM | POA: Diagnosis not present

## 2023-04-25 DIAGNOSIS — H905 Unspecified sensorineural hearing loss: Secondary | ICD-10-CM | POA: Diagnosis not present

## 2023-04-30 DIAGNOSIS — H524 Presbyopia: Secondary | ICD-10-CM | POA: Diagnosis not present

## 2023-04-30 DIAGNOSIS — H43393 Other vitreous opacities, bilateral: Secondary | ICD-10-CM | POA: Diagnosis not present

## 2023-06-29 DIAGNOSIS — H6123 Impacted cerumen, bilateral: Secondary | ICD-10-CM | POA: Diagnosis not present

## 2023-06-29 DIAGNOSIS — H6502 Acute serous otitis media, left ear: Secondary | ICD-10-CM | POA: Diagnosis not present

## 2023-07-02 DIAGNOSIS — J0191 Acute recurrent sinusitis, unspecified: Secondary | ICD-10-CM | POA: Diagnosis not present

## 2023-07-02 DIAGNOSIS — Z6836 Body mass index (BMI) 36.0-36.9, adult: Secondary | ICD-10-CM | POA: Diagnosis not present

## 2023-07-02 DIAGNOSIS — G4733 Obstructive sleep apnea (adult) (pediatric): Secondary | ICD-10-CM | POA: Diagnosis not present

## 2023-07-02 DIAGNOSIS — I480 Paroxysmal atrial fibrillation: Secondary | ICD-10-CM | POA: Diagnosis not present

## 2023-07-02 DIAGNOSIS — E1169 Type 2 diabetes mellitus with other specified complication: Secondary | ICD-10-CM | POA: Diagnosis not present

## 2023-07-04 ENCOUNTER — Encounter: Payer: Self-pay | Admitting: Cardiology

## 2023-07-04 ENCOUNTER — Ambulatory Visit: Payer: PPO | Attending: Cardiology | Admitting: Cardiology

## 2023-07-04 VITALS — BP 120/72 | HR 96 | Ht 67.0 in | Wt 227.8 lb

## 2023-07-04 DIAGNOSIS — I480 Paroxysmal atrial fibrillation: Secondary | ICD-10-CM

## 2023-07-04 DIAGNOSIS — D6869 Other thrombophilia: Secondary | ICD-10-CM

## 2023-07-04 DIAGNOSIS — E782 Mixed hyperlipidemia: Secondary | ICD-10-CM

## 2023-07-04 NOTE — Patient Instructions (Signed)
Medication Instructions:  Your physician recommends that you continue on your current medications as directed. Please refer to the Current Medication list given to you today.  *If you need a refill on your cardiac medications before your next appointment, please call your pharmacy*   Lab Work: NONE   If you have labs (blood work) drawn today and your tests are completely normal, you will receive your results only by: MyChart Message (if you have MyChart) OR A paper copy in the mail If you have any lab test that is abnormal or we need to change your treatment, we will call you to review the results.   Testing/Procedures: NONE    Follow-Up: At [facility], you and your health needs are our priority.  As part of our continuing mission to provide you with exceptional heart care, we have created designated Provider Care Teams.  These Care Teams include your primary Cardiologist (physician) and Advanced Practice Providers (APPs -  Physician Assistants and Nurse Practitioners) who all work together to provide you with the care you need, when you need it.  We recommend signing up for the patient portal called "MyChart".  Sign up information is provided on this After Visit Summary.  MyChart is used to connect with patients for Virtual Visits (Telemedicine).  Patients are able to view lab/test results, encounter notes, upcoming appointments, etc.  Non-urgent messages can be sent to your provider as well.   To learn more about what you can do with MyChart, go to [email or web address].    Your next appointment:   6 month(s)  Provider:   You may see Dina Rich, MD or the following Advanced Practice Provider on your designated Care Team:   Sharlene Dory, NP    Other Instructions Thank you for choosing [facility]!

## 2023-07-04 NOTE — Progress Notes (Signed)
Clinical Summary Brian Leach is a 73 y.o.male seen today for follow up of the following medical problems.    AFib/PAF -new diagnosis during 11/2020 admission with DVT/PE - self converted during that admission, started on diltiazem - recurrent afib as outpatient that was persistent, referred for DCCV - 04/2021 DCCV succesful but right back into afib minutes later       - EKG shows rate controlled afib 70s - no bleeding on eliquis - compliant with meds  2.History of unprovoked DVT/PE - he is on eliquis       3. Hyperlipidemia - he is on crestor   11/2021 TC 131 TG 60 HDL 59 LDL 59 - 02/2023 TC 308 TG 657 HDL 56 LDL 59   4. OSA - followed by Dr Craige Cotta - using cpap Past Medical History:  Diagnosis Date   Anxiety    Arthritis    BPH (benign prostatic hyperplasia)    Complication of anesthesia    difficult intubation   Diabetes mellitus without complication (HCC)    Dysrhythmia    GERD (gastroesophageal reflux disease)    History of gout    Hyperlipidemia    Left leg DVT (HCC) 11/2020   Paroxysmal atrial fibrillation (HCC)    Pulmonary embolism (HCC) 11/2020   Seasonal allergies    Sleep apnea      Allergies  Allergen Reactions   Codeine Rash     Current Outpatient Medications  Medication Sig Dispense Refill   acetaminophen (TYLENOL) 500 MG tablet Take 500-1,000 mg by mouth every 8 (eight) hours as needed for moderate pain.     apixaban (ELIQUIS) 5 MG TABS tablet Take 5 mg by mouth 2 (two) times daily.     azelastine (ASTELIN) 0.1 % nasal spray Place 1 spray into both nostrils 2 (two) times daily as needed for rhinitis. Use in each nostril as directed     diltiazem (CARDIZEM CD) 180 MG 24 hr capsule Take 180 mg by mouth daily.     ketoconazole (NIZORAL) 2 % cream Apply 1 Application topically daily.     loratadine (CLARITIN) 10 MG tablet Take 10 mg by mouth daily as needed for allergies.     rosuvastatin (CRESTOR) 10 MG tablet Take 1 tablet (10 mg total)  by mouth daily at 6 PM. 30 tablet 1   sertraline (ZOLOFT) 50 MG tablet Take 75 mg by mouth daily.     No current facility-administered medications for this visit.     Past Surgical History:  Procedure Laterality Date   CARDIOVERSION N/A 04/27/2021   Procedure: CARDIOVERSION;  Surgeon: Jonelle Sidle, MD;  Location: [facility] OR[facility];  Service: Cardiovascular;  Laterality: N/A;   COLONOSCOPY N/A 04/07/2013   Procedure: COLONOSCOPY;  Surgeon: Corbin Ade, MD;  Location: [facility] END[facility] SU[facility];  Service: Endoscopy;  Laterality: N/A;  7:30 AM   COLONOSCOPY N/A 05/14/2018   Procedure: COLONOSCOPY;  Surgeon: Corbin Ade, MD;  Location: [facility];  Service: Endoscopy;  Laterality: N/A;  10:30   COLONOSCOPY WITH PROPOFOL N/A 10/27/2021   Procedure: COLONOSCOPY WITH PROPOFOL;  Surgeon: Corbin Ade, MD;  Location: [facility];  Service: Endoscopy;  Laterality: N/A;  1:00pm   KNEE ARTHROSCOPY Left    POLYPECTOMY  05/14/2018   Procedure: POLYPECTOMY;  Surgeon: Corbin Ade, MD;  Location: [facility];  Service: Endoscopy;;  colon    POLYPECTOMY  10/27/2021   Procedure: POLYPECTOMY;  Surgeon: Corbin Ade, MD;  Location:  [facility];  Service: Endoscopy;;     Allergies  Allergen Reactions   Codeine Rash      Family History  Problem Relation Age of Onset   Heart disease Brother      Social History Brian Leach reports that he has never smoked. He has never used smokeless tobacco. Brian Leach reports no history of alcohol use.   Review of Systems CONSTITUTIONAL: No weight loss, fever, chills, weakness or fatigue.  HEENT: Eyes: No visual loss, blurred vision, double vision or yellow sclerae.No hearing loss, sneezing, congestion, runny nose or sore throat.  SKIN: No rash or itching.  CARDIOVASCULAR: per hpi RESPIRATORY: No shortness of breath, cough or sputum.  GASTROINTESTINAL: No anorexia, nausea, vomiting or diarrhea. No abdominal pain or blood.  GENITOURINARY: No burning  on urination, no polyuria NEUROLOGICAL: No headache, dizziness, syncope, paralysis, ataxia, numbness or tingling in the extremities. No change in bowel or bladder control.  MUSCULOSKELETAL: No muscle, back pain, joint pain or stiffness.  LYMPHATICS: No enlarged nodes. No history of splenectomy.  PSYCHIATRIC: No history of depression or anxiety.  ENDOCRINOLOGIC: No reports of sweating, cold or heat intolerance. No polyuria or polydipsia.  Marland Kitchen   Physical Examination Today's Vitals   07/04/23 1529  BP: 120/72  Pulse: 96  SpO2: 98%  Weight: 227 lb 12.8 oz (103.3 kg)  Height: 5\' 7"  (1.702 m)   Body mass index is 35.68 kg/m.  Gen: resting comfortably, no acute distress HEENT: no scleral icterus, pupils equal round and reactive, no palptable cervical adenopathy,  CV: RRR, no m/rg, no jvd Resp: Clear to auscultation bilaterally GI: abdomen is soft, non-tender, non-distended, normal bowel sounds, no hepatosplenomegaly MSK: extremities are warm, no edema.  Skin: warm, no rash Neuro:  no focal deficits Psych: appropriate affect   Diagnostic Studies  11/2020 echo IMPRESSIONS     1. Left ventricular ejection fraction, by estimation, is 60 to 65%. The  left ventricle has normal function. The left ventricle has no regional  wall motion abnormalities. There is mild left ventricular hypertrophy.  Left ventricular diastolic parameters  are indeterminate.   2. Right ventricular systolic function was not well visualized. The right  ventricular size is not well visualized.   3. The mitral valve is normal in structure. No evidence of mitral valve  regurgitation. No evidence of mitral stenosis.   4. The aortic valve has an indeterminant number of cusps. Aortic valve  regurgitation is not visualized. No aortic stenosis is present.       Assessment and Plan  1.PAF/acquired thrombophilia - failed DCCV, he is doing well with just rate control.  - EKG today shows rate controlled afib - no  symptoms, continue current meds including eliquis for stroke prevention   2. Hyperlipidemia - remains at goal, continue current meds   F/u 6 months      Antoine Poche, M.D.

## 2023-07-31 ENCOUNTER — Telehealth: Payer: Self-pay | Admitting: Primary Care

## 2023-07-31 NOTE — Telephone Encounter (Signed)
LVM for patient to call and discuss My Chart visit/ rescheduling ----will send My Chart message as well kf

## 2023-08-01 ENCOUNTER — Ambulatory Visit: Payer: PPO | Admitting: Primary Care

## 2023-09-05 DIAGNOSIS — E7800 Pure hypercholesterolemia, unspecified: Secondary | ICD-10-CM | POA: Diagnosis not present

## 2023-09-05 DIAGNOSIS — E7801 Familial hypercholesterolemia: Secondary | ICD-10-CM | POA: Diagnosis not present

## 2023-09-05 DIAGNOSIS — D649 Anemia, unspecified: Secondary | ICD-10-CM | POA: Diagnosis not present

## 2023-09-05 DIAGNOSIS — E1165 Type 2 diabetes mellitus with hyperglycemia: Secondary | ICD-10-CM | POA: Diagnosis not present

## 2023-09-05 DIAGNOSIS — R739 Hyperglycemia, unspecified: Secondary | ICD-10-CM | POA: Diagnosis not present

## 2023-09-12 DIAGNOSIS — G4733 Obstructive sleep apnea (adult) (pediatric): Secondary | ICD-10-CM | POA: Diagnosis not present

## 2023-09-12 DIAGNOSIS — I2510 Atherosclerotic heart disease of native coronary artery without angina pectoris: Secondary | ICD-10-CM | POA: Diagnosis not present

## 2023-09-12 DIAGNOSIS — I480 Paroxysmal atrial fibrillation: Secondary | ICD-10-CM | POA: Diagnosis not present

## 2023-09-12 DIAGNOSIS — E1165 Type 2 diabetes mellitus with hyperglycemia: Secondary | ICD-10-CM | POA: Diagnosis not present

## 2023-09-12 DIAGNOSIS — Z6835 Body mass index (BMI) 35.0-35.9, adult: Secondary | ICD-10-CM | POA: Diagnosis not present

## 2023-09-27 ENCOUNTER — Encounter: Payer: Self-pay | Admitting: Nurse Practitioner

## 2023-09-27 ENCOUNTER — Ambulatory Visit: Payer: PPO | Admitting: Nurse Practitioner

## 2023-09-27 VITALS — BP 118/74 | HR 82 | Ht 67.0 in | Wt 228.0 lb

## 2023-09-27 DIAGNOSIS — G4733 Obstructive sleep apnea (adult) (pediatric): Secondary | ICD-10-CM | POA: Diagnosis not present

## 2023-09-27 NOTE — Assessment & Plan Note (Addendum)
 Mild OSA on CPAP. Suboptimal compliance. He does have excellent control and receives benefit from use. Encouraged him to increase utilization for goal of >4-6 hours for 70% or more nights. We also reviewed other potential treatment options for mild OSA but he prefers to continue to use CPAP. Reviewed issus with insurance coverage with inadequate use. He is agreeable to using more. Reassess at follow up. Will also adjust his settings to auto set 6-10 cmH2O. He is aware to call with any issues. Once he improves usage, we can try to get him set up with an alternative DME company since he has had issues with [employer]. Provided with rx for supplies. Safe driving practices reviewed. Aware of proper care/use.   Patient Instructions  Increase use of CPAP every night, minimum of 4-6 hours a night.  Change equipment as directed. Wash your tubing with warm soap and water daily, hang to dry. Wash humidifier portion weekly. Use bottled, distilled water and change daily Be aware of reduced alertness and do not drive or operate heavy machinery if experiencing this or drowsiness.  Exercise encouraged, as tolerated. Healthy weight management discussed.  Avoid or decrease alcohol consumption and medications that make you more sleepy, if possible. Notify if persistent daytime sleepiness occurs even with consistent use of PAP therapy.  We discussed how untreated sleep apnea puts an individual at risk for cardiac arrhthymias, pulm HTN, DM, stroke and increases their risk for daytime accidents. We also briefly reviewed treatment options including weight loss, side sleeping position, oral appliance, CPAP therapy or referral to ENT for possible surgical options  I gave you a prescription for CPAP supplies today Adjust CPAP settings to auto 5-10 cmH2O. Let me know if you still feel like you're not getting enough air or it's blowing too much   If you're using you CPAP more, we can look at changing your medical supply company as  long as your current CPAP is paid off. Insurance usually needs 70% over 4 hours of use on average to continue to cover supplies/CPAP  Follow up in one year with sleep provider, or sooner, if needed

## 2023-09-27 NOTE — Progress Notes (Signed)
 @Patient  ID: Brian Leach, male    DOB: [birth year], 73 y.o.   MRN: [id number]  Chief Complaint  Patient presents with   Sleep Apnea    Referring provider: Alston Jerry, MD  HPI: 73 year old male, never smoker followed for OSA on CPAP. He is a former patient of Dr. Carlyle Childes and last seen in office 08/02/2022. Past medical history significant for PAF, hx of PE and DVT on Eliquis, allergies, HLD.  TEST/EVENTS:  08/14/2021 PSG: AHI 9.7, SpO2 low 90%  08/02/2022: OV with Dr. Matilde Son. OSA on CPAP. Doing better with CPAP. No issues with mask fit. He sleeps for a couple hours in his chair. Better position for him when his arthritis flares up. Forgot to check about an oral appliance.   09/27/2023: Today - follow up Patient presents today for follow up with his wife. He's putting his CPAP on the majority of nights but only getting a few hours most nights. He finds it hard to wear in the bed because his bed is elevated and he tends to pull it off the night stand. He does wear it when he's in his recliner. The dogs just bother him more when he is sleeping in this. He does sleep better with CPAP than without. Feels energy levels are better and he's more rested. Wife doesn't notice him snoring with CPAP. Denies any drowsy driving. Doesn't think he could do an oral appliance as his gag reflex is sensitive. He does feel like his pressures could be higher sometimes. No issues with mask.  06/29/2023-09/26/2023: CPAP 6 cmH2O 86/90 days; 41% >4 hr; average use 3 hr 38 min [place] [address] 20.7 AHI 1.3  Allergies  Allergen Reactions   Codeine Rash    Immunization History  Administered Date(s) Administered   Fluad Quad(high Dose 65+) 03/12/2021   Influenza,inj,quad, With Preservative 03/27/2018    Past Medical History:  Diagnosis Date   Anxiety    Arthritis    BPH (benign prostatic hyperplasia)    Complication of anesthesia    difficult intubation   Diabetes mellitus without complication (HCC)     Dysrhythmia    GERD (gastroesophageal reflux disease)    History of gout    Hyperlipidemia    Left leg DVT (HCC) 11/2020   Paroxysmal atrial fibrillation (HCC)    Pulmonary embolism (HCC) 11/2020   Seasonal allergies    Sleep apnea     Tobacco History: Social History   Tobacco Use  Smoking Status Never  Smokeless Tobacco Never   Counseling given: Not Answered   Outpatient Medications Prior to Visit  Medication Sig Dispense Refill   acetaminophen (TYLENOL) 500 MG tablet Take 500-1,000 mg by mouth every 8 (eight) hours as needed for moderate pain.     apixaban (ELIQUIS) 5 MG TABS tablet Take 5 mg by mouth 2 (two) times daily.     azelastine (ASTELIN) 0.1 % nasal spray Place 1 spray into both nostrils 2 (two) times daily as needed for rhinitis. Use in each nostril as directed     diltiazem (CARDIZEM CD) 180 MG 24 hr capsule Take 180 mg by mouth daily.     loratadine (CLARITIN) 10 MG tablet Take 10 mg by mouth daily as needed for allergies.     rosuvastatin (CRESTOR) 10 MG tablet Take 1 tablet (10 mg total) by mouth daily at 6 PM. 30 tablet 1   sertraline (ZOLOFT) 50 MG tablet Take 75 mg by mouth daily.     ketoconazole (NIZORAL)  2 % cream Apply 1 Application topically daily. (Patient not taking: Reported on 07/04/2023)     No facility-administered medications prior to visit.     Review of Systems:   Constitutional: No weight loss or gain, night sweats, fevers, chills, or lassitude. +fatigue (improves with CPAP) HEENT: No headaches, difficulty swallowing, tooth/dental problems, or sore throat. +seasonal allergies CV:  No chest pain, orthopnea, PND, swelling in lower extremities, anasarca, dizziness, palpitations, syncope Resp: +snoring (without CPAP). No shortness of breath with exertion or at rest. No excess mucus or change in color of mucus. No productive or non-productive. No hemoptysis. No wheezing.   GI:  No heartburn, indigestion GU: No nocturia  Skin: No rash, lesions,  ulcerations MSK:  +chronic joint pains Neuro: No dizziness or lightheadedness.  Psych: No depression or anxiety. Mood stable. +sleep disturbance    Physical Exam:  BP 118/74   Pulse 82   Ht 5\' 7"  (1.702 m)   Wt 228 lb (103.4 kg)   SpO2 98%   BMI 35.71 kg/m   GEN: Pleasant, interactive, well-appearing; obese; in no acute distress HEENT:  Normocephalic and atraumatic. PERRLA. Sclera white. Nasal turbinates pink, moist and patent bilaterally. No rhinorrhea present. Oropharynx pink and moist, without exudate or edema. No lesions, ulcerations, or postnasal drip. Mallampati III/IV NECK:  Supple w/ fair ROM. No JVD present. Thyroid symmetrical with no goiter or nodules palpated. No lymphadenopathy.   CV: Irregular rhythm, rate controlled, no m/r/g, no peripheral edema. Pulses intact, +2 bilaterally. No cyanosis, pallor or clubbing. PULMONARY:  Unlabored, regular breathing. Clear bilaterally A&P w/o wheezes/rales/rhonchi. No accessory muscle use.  GI: BS present and normoactive. Soft, non-tender to palpation. No organomegaly or masses detected.  MSK: No erythema, warmth or tenderness. Cap refil <2 sec all extrem.  Neuro: A/Ox3. No focal deficits noted.   Skin: Warm, no lesions or rashe Psych: Normal affect and behavior. Judgement and thought content appropriate.     Lab Results:  CBC    Component Value Date/Time   WBC 10.2 11/27/2020 0450   RBC 5.60 11/27/2020 0450   HGB 17.3 (H) 11/27/2020 0450   HCT 52.4 (H) 11/27/2020 0450   PLT 233 11/27/2020 0450   MCV 93.6 11/27/2020 0450   MCH 30.9 11/27/2020 0450   MCHC 33.0 11/27/2020 0450   RDW 14.1 11/27/2020 0450   LYMPHSABS 1.9 11/24/2020 1612   MONOABS 1.4 (H) 11/24/2020 1612   EOSABS 0.3 11/24/2020 1612   BASOSABS 0.1 11/24/2020 1612    BMET    Component Value Date/Time   NA 135 10/24/2021 1049   K 4.4 10/24/2021 1049   CL 107 10/24/2021 1049   CO2 22 10/24/2021 1049   GLUCOSE 90 10/24/2021 1049   BUN 23 10/24/2021  1049   CREATININE 0.83 10/24/2021 1049   CREATININE 1.01 02/09/2021 1053   CALCIUM 8.8 (L) 10/24/2021 1049   GFRNONAA >60 10/24/2021 1049    BNP    Component Value Date/Time   BNP 61.0 11/24/2020 1612     Imaging:  No results found.  Administration History     None           No data to display          No results found for: "NITRICOXIDE"      Assessment & Plan:   OSA on CPAP Mild OSA on CPAP. Suboptimal compliance. He does have excellent control and receives benefit from use. Encouraged him to increase utilization for goal of >4-6 hours for 70% or  more nights. We also reviewed other potential treatment options for mild OSA but he prefers to continue to use CPAP. Reviewed issus with insurance coverage with inadequate use. He is agreeable to using more. Reassess at follow up. Will also adjust his settings to auto set 6-10 cmH2O. He is aware to call with any issues. Once he improves usage, we can try to get him set up with an alternative DME company since he has had issues with [employer]. Provided with rx for supplies. Safe driving practices reviewed. Aware of proper care/use.   Patient Instructions  Increase use of CPAP every night, minimum of 4-6 hours a night.  Change equipment as directed. Wash your tubing with warm soap and water daily, hang to dry. Wash humidifier portion weekly. Use bottled, distilled water and change daily Be aware of reduced alertness and do not drive or operate heavy machinery if experiencing this or drowsiness.  Exercise encouraged, as tolerated. Healthy weight management discussed.  Avoid or decrease alcohol consumption and medications that make you more sleepy, if possible. Notify if persistent daytime sleepiness occurs even with consistent use of PAP therapy.  We discussed how untreated sleep apnea puts an individual at risk for cardiac arrhthymias, pulm HTN, DM, stroke and increases their risk for daytime accidents. We also briefly reviewed  treatment options including weight loss, side sleeping position, oral appliance, CPAP therapy or referral to ENT for possible surgical options  I gave you a prescription for CPAP supplies today Adjust CPAP settings to auto 5-10 cmH2O. Let me know if you still feel like you're not getting enough air or it's blowing too much   If you're using you CPAP more, we can look at changing your medical supply company as long as your current CPAP is paid off. Insurance usually needs 70% over 4 hours of use on average to continue to cover supplies/CPAP  Follow up in one year with sleep provider, or sooner, if needed    Advised if symptoms do not improve or worsen, to please contact office for sooner follow up or seek emergency care.   I spent 35 minutes of dedicated to the care of this patient on the date of this encounter to include pre-visit review of records, face-to-face time with the patient discussing conditions above, post visit ordering of testing, clinical documentation with the electronic health record, making appropriate referrals as documented, and communicating necessary findings to members of the patients care team.  Roetta Clarke, NP 09/27/2023  Pt aware and understands NP's role.

## 2023-09-27 NOTE — Patient Instructions (Addendum)
 Increase use of CPAP every night, minimum of 4-6 hours a night.  Change equipment as directed. Wash your tubing with warm soap and water daily, hang to dry. Wash humidifier portion weekly. Use bottled, distilled water and change daily Be aware of reduced alertness and do not drive or operate heavy machinery if experiencing this or drowsiness.  Exercise encouraged, as tolerated. Healthy weight management discussed.  Avoid or decrease alcohol consumption and medications that make you more sleepy, if possible. Notify if persistent daytime sleepiness occurs even with consistent use of PAP therapy.  We discussed how untreated sleep apnea puts an individual at risk for cardiac arrhthymias, pulm HTN, DM, stroke and increases their risk for daytime accidents. We also briefly reviewed treatment options including weight loss, side sleeping position, oral appliance, CPAP therapy or referral to ENT for possible surgical options  I gave you a prescription for CPAP supplies today Adjust CPAP settings to auto 5-10 cmH2O. Let me know if you still feel like you're not getting enough air or it's blowing too much   If you're using you CPAP more, we can look at changing your medical supply company as long as your current CPAP is paid off. Insurance usually needs 70% over 4 hours of use on average to continue to cover supplies/CPAP  Follow up in one year with sleep provider, or sooner, if needed

## 2023-11-16 DIAGNOSIS — H43811 Vitreous degeneration, right eye: Secondary | ICD-10-CM | POA: Diagnosis not present

## 2023-11-16 DIAGNOSIS — H43813 Vitreous degeneration, bilateral: Secondary | ICD-10-CM | POA: Diagnosis not present

## 2023-11-16 DIAGNOSIS — H2513 Age-related nuclear cataract, bilateral: Secondary | ICD-10-CM | POA: Diagnosis not present

## 2023-11-16 DIAGNOSIS — H33011 Retinal detachment with single break, right eye: Secondary | ICD-10-CM | POA: Diagnosis not present

## 2023-11-19 ENCOUNTER — Telehealth: Payer: Self-pay | Admitting: Cardiology

## 2023-11-19 NOTE — Telephone Encounter (Signed)
   Pre-operative Risk Assessment    Patient Name: Brian Leach  DOB: 20-Jul-1950 MRN: [id number]      Request for Surgical Clearance    Procedure:  Vitrectomy  Date of Surgery:  Clearance 11/22/23                                 Surgeon:  Linard Reno, MD Surgeon's Group or Practice Name:  [facility] Phone number:  [phone] Fax number:  [phone]   Type of Clearance Requested:   - Medical    Type of Anesthesia:  Not Indicated   Additional requests/questions:  Please fax a copy of Clearance to the surgeon's office.  Signed, Georgia Kipper   11/19/2023, 1:31 PM

## 2023-11-20 DIAGNOSIS — H25813 Combined forms of age-related cataract, bilateral: Secondary | ICD-10-CM | POA: Diagnosis not present

## 2023-11-20 DIAGNOSIS — H33001 Unspecified retinal detachment with retinal break, right eye: Secondary | ICD-10-CM | POA: Diagnosis not present

## 2023-11-20 DIAGNOSIS — H43812 Vitreous degeneration, left eye: Secondary | ICD-10-CM | POA: Diagnosis not present

## 2023-11-21 ENCOUNTER — Telehealth: Payer: Self-pay | Admitting: *Deleted

## 2023-11-21 ENCOUNTER — Encounter: Payer: Self-pay | Admitting: Nurse Practitioner

## 2023-11-21 ENCOUNTER — Ambulatory Visit: Attending: Nurse Practitioner | Admitting: Nurse Practitioner

## 2023-11-21 DIAGNOSIS — Z01810 Encounter for preprocedural cardiovascular examination: Secondary | ICD-10-CM | POA: Diagnosis not present

## 2023-11-21 NOTE — Telephone Encounter (Addendum)
 Patient with diagnosis of DVT/Pe and A Fib on Eliquis  for anticoagulation.    Procedure: vitrectomy Date of procedure: 11/23/23   CHA2DS2-VASc Score = 2  This indicates a 2.2% annual risk of stroke. The patient's score is based upon: CHF History: 0 HTN History: 0 Diabetes History: 1 Stroke History: 0 Vascular Disease History: 0 Age Score: 1 Gender Score: 0   CrCl 104 ml/min Platelet count 269K   Per office protocol, patient can hold Eliquis  for 2days prior to procedure.     **This guidance is not considered finalized until pre-operative APP has relayed final recommendations.**

## 2023-11-21 NOTE — Telephone Encounter (Signed)
 CORRECT FAX# [phone]  I left a detailed message for [place] that I have sent to our preop APP and preop Pharm-d for recommendations,   Surgery is planned for tomorrow.   I am also to clarifying with preop APP today if the pt is going to need a tele preop appt.

## 2023-11-21 NOTE — Telephone Encounter (Signed)
 Looks like patient needs medical clearance only, not eliquis  hold. Last office visit in Jan 2025. Would need tele visit today.   Are we able to arrange?

## 2023-11-21 NOTE — Telephone Encounter (Signed)
 I just confirmed with surgery scheduler [place] if Eliquis  needs to be held. Per [place], yes need Eliquis  recommendations, since it is the day before now, they will have to go with an one day hold so if the pt can hold his dose of Eliquis  today.   I told [place] that I just s/w the pt and he said he held one dose, which he meant was today. [place] is aware as well the pt has tele preop appt 3:40 today.   I will update the preop APP and the preop Pharm-d.

## 2023-11-21 NOTE — Telephone Encounter (Signed)
 I just confirmed with surgery scheduler Brian Brian Leach if Brian Brian Leach  needs to be held. Per Brian Brian Leach, yes need Brian Brian Leach  recommendations, since it is the day before now, they will have to go with an one day hold so if the pt can hold his dose of Brian Brian Leach  today.    I told Brian Brian Leach that I just s/w the pt and he said he held one dose, which he meant was today. Brian Brian Leach is aware as well the pt has tele preop appt 3:40 today.    I will update the preop APP and the preop Pharm-d.     Med rec and consent are done.     Patient Consent for Virtual Visit        Brian Brian Leach has provided verbal consent on 11/21/2023 for a virtual visit (video or telephone).   CONSENT FOR VIRTUAL VISIT FOR:  Brian Brian Leach  By participating in this virtual visit I agree to the following:  I hereby voluntarily request, consent and authorize Brian Brian Leach and its employed or contracted physicians, physician assistants, nurse practitioners or other licensed health care professionals (the Practitioner), to provide me with telemedicine health care services (the "Services) as deemed necessary by the treating Practitioner. I acknowledge and consent to receive the Services by the Practitioner via telemedicine. I understand that the telemedicine visit will involve communicating with the Practitioner through live audiovisual communication technology and the disclosure of certain medical information by electronic transmission. I acknowledge that I have been given the opportunity to request an in-person assessment or other available alternative prior to the telemedicine visit and am voluntarily participating in the telemedicine visit.  I understand that I have the right to withhold or withdraw my consent to the use of telemedicine in the course of my care at any time, without affecting my right to future care or treatment, and that the Practitioner or I may terminate the telemedicine visit at any time. I understand that I have the  right to inspect all information obtained and/or recorded in the course of the telemedicine visit and may receive copies of available information for a reasonable fee.  I understand that some of the potential risks of receiving the Services via telemedicine include:  Delay or interruption in medical evaluation due to technological equipment failure or disruption; Information transmitted may not be sufficient (e.g. poor resolution of images) to allow for appropriate medical decision making by the Practitioner; and/or  In rare instances, security protocols could fail, causing a breach of personal health information.  Furthermore, I acknowledge that it is my responsibility to provide information about my medical history, conditions and care that is complete and accurate to the best of my ability. I acknowledge that Practitioner's advice, recommendations, and/or decision may be based on factors not within their control, such as incomplete or inaccurate data provided by me or distortions of diagnostic images or specimens that may result from electronic transmissions. I understand that the practice of medicine is not an exact science and that Practitioner makes no warranties or guarantees regarding treatment outcomes. I acknowledge that a copy of this consent can be made available to me via my patient portal Brian Brian Leach), or I can request a printed copy by calling the office of Brian Brian Leach.    I understand that my insurance will be billed for this visit.   I have read or had this consent read to me. I understand the contents of this consent, which adequately explains the benefits and risks of the  Services being provided via telemedicine.  I have been provided ample opportunity to ask questions regarding this consent and the Services and have had my questions answered to my satisfaction. I give my informed consent for the services to be provided through the use of telemedicine in my medical  care

## 2023-11-21 NOTE — Telephone Encounter (Signed)
 I will send back to preop APP and to preop Pharm-d for recommendations.   I will also confirm with preop APP today [name], NP ois the pt going to need a tele visit for preop clearance.   I will send FYI notes to [place] with requesting office.

## 2023-11-21 NOTE — Telephone Encounter (Signed)
 Pt has been added on tele preop appt today per preop APP. Med rec and consent are done.

## 2023-11-21 NOTE — Telephone Encounter (Signed)
 [place] from [employer] calling to get update on clearance due to surgery being scheduled for tomorrow requesting cb to [phone] option #3

## 2023-11-21 NOTE — Progress Notes (Signed)
 Virtual Visit via Telephone Note   Because of Brian Leach co-morbid illnesses, he is at least at moderate risk for complications without adequate follow up.  This format is felt to be most appropriate for this patient at this time.  Due to technical limitations with video connection Web designer), today's appointment will be conducted as an audio only telehealth visit, and Brian Leach verbally agreed to proceed in this manner.   All issues noted in this document were discussed and addressed.  No physical exam could be performed with this format.  Evaluation Performed:  Preoperative cardiovascular risk assessment _____________   Date:  11/21/2023   Patient ID:  Brian Leach, Brian Leach, MRN [id number] Patient Location:  Home Provider location:   Office  Primary Care Provider:  Alston Jerry, MD Primary Cardiologist:  Armida Lander, MD  Chief Complaint / Patient Profile   73 y.o. y/o male with a h/o PAF on chronic anticoagulation, hyperlipidemia, unprovoked DVT/PE, OSA who is pending vitrectomy with Dr. Elnita Hai on 11/22/23 and presents today for telephonic preoperative cardiovascular risk assessment.  History of Present Illness    Brian Leach is a 73 y.o. male who presents via audio/video conferencing for a telehealth visit today.  Pt was last seen in cardiology clinic on 07/04/2023 by Dr. Amanda Jungling.  At that time Brian Leach was doing well.  The patient is now pending procedure as outlined above. Since his last visit, he denies chest pain, shortness of breath, lower extremity edema, fatigue, palpitations, melena, hematuria, hemoptysis, diaphoresis, weakness, presyncope, syncope, orthopnea, and PND. He remains active at home and is able to achieve > 4 METS activity without concerning cardiac symptoms    Past Medical History    Past Medical History:  Diagnosis Date   Anxiety    Arthritis    BPH (benign prostatic hyperplasia)    Complication of anesthesia     difficult intubation   Diabetes mellitus without complication (HCC)    Dysrhythmia    GERD (gastroesophageal reflux disease)    History of gout    Hyperlipidemia    Left leg DVT (HCC) 11/2020   Paroxysmal atrial fibrillation (HCC)    Pulmonary embolism (HCC) 11/2020   Seasonal allergies    Sleep apnea    Past Surgical History:  Procedure Laterality Date   CARDIOVERSION N/A 04/27/2021   Procedure: CARDIOVERSION;  Surgeon: Gerard Knight, MD;  Location: [facility];  Service: Cardiovascular;  Laterality: N/A;   COLONOSCOPY N/A 04/07/2013   Procedure: COLONOSCOPY;  Surgeon: Suzette Espy, MD;  [facility];  Service: Endoscopy;  Laterality: N/A;  7:30 AM   COLONOSCOPY N/A 05/14/2018   Procedure: COLONOSCOPY;  Surgeon: Suzette Espy, MD;  [facility]: [facility];  Service: Endoscopy;  Laterality: N/A;  10:30   COLONOSCOPY WITH PROPOFOL  N/A 10/27/2021   Procedure: COLONOSCOPY WITH PROPOFOL ;  Surgeon: Suzette Espy, MD;  Location: [facility];  Service: Endoscopy;  Laterality: N/A;  1:00pm   KNEE ARTHROSCOPY Left    POLYPECTOMY  05/14/2018   Procedure: POLYPECTOMY;  Surgeon: Suzette Espy, MD;  [facility];  Service: Endoscopy;;  colon    POLYPECTOMY  10/27/2021   Procedure: POLYPECTOMY;  Surgeon: Suzette Espy, MD;  [facility];  Service: Endoscopy;;    Allergies  Allergies  Allergen Reactions   Codeine Rash    Home Medications    Prior to Admission medications   Medication Sig Start Date End  Date Taking? Authorizing Provider  acetaminophen  (TYLENOL ) 500 MG tablet Take 500-1,000 mg by mouth every 8 (eight) hours as needed for moderate pain.    [provider]  apixaban  (ELIQUIS ) 5 MG TABS tablet Take 5 mg by mouth 2 (two) times daily.    [provider]  azelastine (ASTELIN) 0.1 % nasal spray Place 1 spray into both nostrils 2 (two) times daily as needed for rhinitis. Use in each nostril as directed    [provider]  diltiazem  (CARDIZEM  CD) 180 MG 24 hr capsule Take 180 mg by mouth daily.    [provider]  loratadine (CLARITIN) 10 MG tablet Take 10 mg by mouth daily as needed for allergies.    [provider]  rosuvastatin  (CRESTOR ) 10 MG tablet Take 1 tablet (10 mg total) by mouth daily at 6 PM. 11/27/20   Tat, Myrtie Atkinson, MD  sertraline (ZOLOFT) 50 MG tablet Take 75 mg by mouth daily. 09/01/21   [provider]    Physical Exam    Vital Signs:  Brian Leach does not have vital signs available for review today.  Given telephonic nature of communication, physical exam is limited. AAOx3. NAD. Normal affect.  Speech and respirations are unlabored.  Accessory Clinical Findings    None  Assessment & Plan    1.  Preoperative Cardiovascular Risk Assessment: According to the Revised Cardiac Risk Index (RCRI), his Perioperative Risk of Major Cardiac Event is (%): 0.4. His Functional Capacity in METs is: 8.23 according to the Duke Activity Status Index (DASI). The patient is doing well from a cardiac perspective. Therefore, based on ACC/AHA guidelines, the patient would be at acceptable risk for the planned procedure without further cardiovascular testing.   The patient was advised that if he develops new symptoms prior to surgery to contact our office to arrange for a follow-up visit, and he verbalized understanding.  Per office protocol, patient can hold Eliquis  for 2 days prior to procedure.  Patient and surgeon are aware that he is holding 1 dose prior to procedure and 1 dose post-procedure due to scheduling.   A copy of this note will be routed to requesting surgeon.  Time:   Today, I have spent 10 minutes with the patient with telehealth technology discussing medical history, symptoms, and management plan.     Gerldine Koch, NP-C  11/21/2023, 3:51 PM [address] [place], [place] [address] Office [phone] Fax [phone]

## 2023-11-21 NOTE — Telephone Encounter (Signed)
 Pt has been added on tele preop appt today per preop APP. Med rec and consent are done.       Patient Consent for Virtual Visit        Brian Leach has provided verbal consent on 11/21/2023 for a virtual visit (video or telephone).   CONSENT FOR VIRTUAL VISIT FOR:  Brian Leach  By participating in this virtual visit I agree to the following:  I hereby voluntarily request, consent and authorize [facility] and its employed or contracted physicians, physician assistants, nurse practitioners or other licensed health care professionals (the Practitioner), to provide me with telemedicine health care services (the "Services) as deemed necessary by the treating Practitioner. I acknowledge and consent to receive the Services by the Practitioner via telemedicine. I understand that the telemedicine visit will involve communicating with the Practitioner through live audiovisual communication technology and the disclosure of certain medical information by electronic transmission. I acknowledge that I have been given the opportunity to request an in-person assessment or other available alternative prior to the telemedicine visit and am voluntarily participating in the telemedicine visit.  I understand that I have the right to withhold or withdraw my consent to the use of telemedicine in the course of my care at any time, without affecting my right to future care or treatment, and that the Practitioner or I may terminate the telemedicine visit at any time. I understand that I have the right to inspect all information obtained and/or recorded in the course of the telemedicine visit and may receive copies of available information for a reasonable fee.  I understand that some of the potential risks of receiving the Services via telemedicine include:  Delay or interruption in medical evaluation due to technological equipment failure or disruption; Information transmitted may not be sufficient  (e.g. poor resolution of images) to allow for appropriate medical decision making by the Practitioner; and/or  In rare instances, security protocols could fail, causing a breach of personal health information.  Furthermore, I acknowledge that it is my responsibility to provide information about my medical history, conditions and care that is complete and accurate to the best of my ability. I acknowledge that Practitioner's advice, recommendations, and/or decision may be based on factors not within their control, such as incomplete or inaccurate data provided by me or distortions of diagnostic images or specimens that may result from electronic transmissions. I understand that the practice of medicine is not an exact science and that Practitioner makes no warranties or guarantees regarding treatment outcomes. I acknowledge that a copy of this consent can be made available to me via my patient portal [facility] MyChart), or I can request a printed copy by calling the office of  [facility].    I understand that my insurance will be billed for this visit.   I have read or had this consent read to me. I understand the contents of this consent, which adequately explains the benefits and risks of the Services being provided via telemedicine.  I have been provided ample opportunity to ask questions regarding this consent and the Services and have had my questions answered to my satisfaction. I give my informed consent for the services to be provided through the use of telemedicine in my medical care

## 2023-11-21 NOTE — Telephone Encounter (Signed)
 Pt is calling checking about holding his medication because his surgery is tomorrow.

## 2023-11-22 DIAGNOSIS — H2511 Age-related nuclear cataract, right eye: Secondary | ICD-10-CM | POA: Diagnosis not present

## 2023-11-22 DIAGNOSIS — H33021 Retinal detachment with multiple breaks, right eye: Secondary | ICD-10-CM | POA: Diagnosis not present

## 2023-12-03 DIAGNOSIS — H43812 Vitreous degeneration, left eye: Secondary | ICD-10-CM | POA: Diagnosis not present

## 2023-12-03 DIAGNOSIS — H33011 Retinal detachment with single break, right eye: Secondary | ICD-10-CM | POA: Diagnosis not present

## 2023-12-03 DIAGNOSIS — Z9889 Other specified postprocedural states: Secondary | ICD-10-CM | POA: Diagnosis not present

## 2023-12-27 DIAGNOSIS — H43812 Vitreous degeneration, left eye: Secondary | ICD-10-CM | POA: Diagnosis not present

## 2023-12-27 DIAGNOSIS — Z9889 Other specified postprocedural states: Secondary | ICD-10-CM | POA: Diagnosis not present

## 2023-12-27 DIAGNOSIS — H33011 Retinal detachment with single break, right eye: Secondary | ICD-10-CM | POA: Diagnosis not present

## 2024-01-02 DIAGNOSIS — H3521 Other non-diabetic proliferative retinopathy, right eye: Secondary | ICD-10-CM | POA: Diagnosis not present

## 2024-01-02 DIAGNOSIS — H33021 Retinal detachment with multiple breaks, right eye: Secondary | ICD-10-CM | POA: Diagnosis not present

## 2024-01-10 DIAGNOSIS — Z9889 Other specified postprocedural states: Secondary | ICD-10-CM | POA: Diagnosis not present

## 2024-01-10 DIAGNOSIS — H35351 Cystoid macular degeneration, right eye: Secondary | ICD-10-CM | POA: Diagnosis not present

## 2024-01-10 DIAGNOSIS — H33011 Retinal detachment with single break, right eye: Secondary | ICD-10-CM | POA: Diagnosis not present

## 2024-01-10 DIAGNOSIS — H3521 Other non-diabetic proliferative retinopathy, right eye: Secondary | ICD-10-CM | POA: Diagnosis not present

## 2024-01-30 ENCOUNTER — Ambulatory Visit: Attending: Cardiology | Admitting: Cardiology

## 2024-01-30 ENCOUNTER — Encounter: Payer: Self-pay | Admitting: *Deleted

## 2024-01-30 ENCOUNTER — Encounter: Payer: Self-pay | Admitting: Cardiology

## 2024-01-30 VITALS — BP 128/81 | HR 72 | Ht 67.5 in | Wt 230.0 lb

## 2024-01-30 DIAGNOSIS — I480 Paroxysmal atrial fibrillation: Secondary | ICD-10-CM

## 2024-01-30 DIAGNOSIS — E782 Mixed hyperlipidemia: Secondary | ICD-10-CM

## 2024-01-30 DIAGNOSIS — D6869 Other thrombophilia: Secondary | ICD-10-CM | POA: Diagnosis not present

## 2024-01-30 NOTE — Patient Instructions (Signed)
 Medication Instructions:  Continue all current medications.   Labwork: none  Testing/Procedures: none  Follow-Up: 6 months   Any Other Special Instructions Will Be Listed Below (If Applicable).   If you need a refill on your cardiac medications before your next appointment, please call your pharmacy.

## 2024-01-30 NOTE — Progress Notes (Signed)
 Clinical Summary Brian Leach is a 73 y.o.male seen today for follow up of the following medical problems.    AFib/PAF -new diagnosis during 11/2020 admission with DVT/PE - self converted during that admission, started on diltiazem  - recurrent afib as outpatient that was persistent, referred for DCCV - 04/2021 DCCV succesful but right back into afib minutes later  - 05/2022 EKG was in SR, looks to still be paroxysmal at times.    - no recent palpitations - compliant with meds -      2.History of unprovoked DVT/PE - he is on eliquis        3. Hyperlipidemia - he is on crestor   11/2021 TC 131 TG 60 HDL 59 LDL 59 - 02/2023 TC 861 TG 865 HDL 56 LDL 59 - labs followed by pcp   4. OSA - followed by pulmonary - using cpap Past Medical History:  Diagnosis Date   Anxiety    Arthritis    BPH (benign prostatic hyperplasia)    Complication of anesthesia    difficult intubation   Diabetes mellitus without complication (HCC)    Dysrhythmia    GERD (gastroesophageal reflux disease)    History of gout    Hyperlipidemia    Left leg DVT (HCC) 11/2020   Paroxysmal atrial fibrillation (HCC)    Pulmonary embolism (HCC) 11/2020   Seasonal allergies    Sleep apnea      Allergies  Allergen Reactions   Codeine Rash     Current Outpatient Medications  Medication Sig Dispense Refill   acetaminophen  (TYLENOL ) 500 MG tablet Take 500-1,000 mg by mouth every 8 (eight) hours as needed for moderate pain.     apixaban  (ELIQUIS ) 5 MG TABS tablet Take 5 mg by mouth 2 (two) times daily.     azelastine (ASTELIN) 0.1 % nasal spray Place 1 spray into both nostrils 2 (two) times daily as needed for rhinitis. Use in each nostril as directed     diltiazem  (CARDIZEM  CD) 180 MG 24 hr capsule Take 180 mg by mouth daily.     loratadine (CLARITIN) 10 MG tablet Take 10 mg by mouth daily as needed for allergies.     rosuvastatin  (CRESTOR ) 10 MG tablet Take 1 tablet (10 mg total) by mouth daily at 6  PM. 30 tablet 1   sertraline (ZOLOFT) 50 MG tablet Take 75 mg by mouth daily.     No current facility-administered medications for this visit.     Past Surgical History:  Procedure Laterality Date   CARDIOVERSION N/A 04/27/2021   Procedure: CARDIOVERSION;  Surgeon: Debera Jayson MATSU, MD;  Location: [facility];  Service: Cardiovascular;  Laterality: N/A;   COLONOSCOPY N/A 04/07/2013   Procedure: COLONOSCOPY;  Surgeon: Lamar CHRISTELLA Hollingshead, MD;  [facility];  Service: Endoscopy;  Laterality: N/A;  7:30 AM   COLONOSCOPY N/A 05/14/2018   Procedure: COLONOSCOPY;  Surgeon: Hollingshead Lamar CHRISTELLA, MD;  [facility];  Service: Endoscopy;  Laterality: N/A;  10:30   COLONOSCOPY WITH PROPOFOL  N/A 10/27/2021   Procedure: COLONOSCOPY WITH PROPOFOL ;  Surgeon: Hollingshead Lamar CHRISTELLA, MD;  [facility];  Service: Endoscopy;  Laterality: N/A;  1:00pm   KNEE ARTHROSCOPY Left    POLYPECTOMY  05/14/2018   Procedure: POLYPECTOMY;  Surgeon: Hollingshead Lamar CHRISTELLA, MD;  [facility];  Service: Endoscopy;;  colon    POLYPECTOMY  10/27/2021   Procedure: POLYPECTOMY;  Surgeon: Hollingshead Lamar CHRISTELLA, MD;  [facility];  Service: Endoscopy;;     Allergies  Allergen Reactions   Codeine Rash      Family History  Problem Relation Age of Onset   Heart disease Brother      Social History Brian Leach reports that he has never smoked. He has never used smokeless tobacco. Brian Leach reports no history of alcohol  use.    Physical Examination Today's Vitals   01/30/24 1345  BP: 128/81  Pulse: 72  SpO2: 98%  Weight: 230 lb (104.3 kg)  Height: 5' 7.5 (1.715 m)   Body mass index is 35.49 kg/m.  Gen: resting comfortably, no acute distress HEENT: no scleral icterus, pupils equal round and reactive, no palptable cervical adenopathy,  CV: RRR, no m/rg, no jvd Resp: Clear to auscultation bilaterally GI: abdomen is soft, non-tender, non-distended, normal bowel sounds, no  hepatosplenomegaly MSK: extremities are warm, no edema.  Skin: warm, no rash Neuro:  no focal deficits Psych: appropriate affect   Diagnostic Studies  11/2020 echo IMPRESSIONS     1. Left ventricular ejection fraction, by estimation, is 60 to 65%. The  left ventricle has normal function. The left ventricle has no regional  wall motion abnormalities. There is mild left ventricular hypertrophy.  Left ventricular diastolic parameters  are indeterminate.   2. Right ventricular systolic function was not well visualized. The right  ventricular size is not well visualized.   3. The mitral valve is normal in structure. No evidence of mitral valve  regurgitation. No evidence of mitral stenosis.   4. The aortic valve has an indeterminant number of cusps. Aortic valve  regurgitation is not visualized. No aortic stenosis is present.     Assessment and Plan  1.PAF/acquired thrombophilia - failed DCCV, he is doing well with just rate control.  - no symptoms, continue current meds including elqiuis for stroke prevention   2. Hyperlipidemia - request pcp labs   F/u 6 months     Dorn PHEBE Ross, M.D.

## 2024-02-05 ENCOUNTER — Encounter: Payer: Self-pay | Admitting: Cardiology

## 2024-02-05 DIAGNOSIS — R058 Other specified cough: Secondary | ICD-10-CM | POA: Diagnosis not present

## 2024-02-05 DIAGNOSIS — I480 Paroxysmal atrial fibrillation: Secondary | ICD-10-CM | POA: Diagnosis not present

## 2024-02-05 DIAGNOSIS — E1165 Type 2 diabetes mellitus with hyperglycemia: Secondary | ICD-10-CM | POA: Diagnosis not present

## 2024-02-05 DIAGNOSIS — Z6835 Body mass index (BMI) 35.0-35.9, adult: Secondary | ICD-10-CM | POA: Diagnosis not present

## 2024-02-05 DIAGNOSIS — J3489 Other specified disorders of nose and nasal sinuses: Secondary | ICD-10-CM | POA: Diagnosis not present

## 2024-02-07 DIAGNOSIS — H35351 Cystoid macular degeneration, right eye: Secondary | ICD-10-CM | POA: Diagnosis not present

## 2024-02-07 DIAGNOSIS — H26491 Other secondary cataract, right eye: Secondary | ICD-10-CM | POA: Diagnosis not present

## 2024-02-07 DIAGNOSIS — T85398A Other mechanical complication of other ocular prosthetic devices, implants and grafts, initial encounter: Secondary | ICD-10-CM | POA: Diagnosis not present

## 2024-02-07 DIAGNOSIS — H3521 Other non-diabetic proliferative retinopathy, right eye: Secondary | ICD-10-CM | POA: Diagnosis not present

## 2024-02-07 DIAGNOSIS — Z9889 Other specified postprocedural states: Secondary | ICD-10-CM | POA: Diagnosis not present

## 2024-02-07 DIAGNOSIS — H33011 Retinal detachment with single break, right eye: Secondary | ICD-10-CM | POA: Diagnosis not present

## 2024-03-12 DIAGNOSIS — H26491 Other secondary cataract, right eye: Secondary | ICD-10-CM | POA: Diagnosis not present

## 2024-03-12 DIAGNOSIS — T85398A Other mechanical complication of other ocular prosthetic devices, implants and grafts, initial encounter: Secondary | ICD-10-CM | POA: Diagnosis not present

## 2024-03-17 DIAGNOSIS — Z0001 Encounter for general adult medical examination with abnormal findings: Secondary | ICD-10-CM | POA: Diagnosis not present

## 2024-03-17 DIAGNOSIS — Z13220 Encounter for screening for lipoid disorders: Secondary | ICD-10-CM | POA: Diagnosis not present

## 2024-03-17 DIAGNOSIS — D649 Anemia, unspecified: Secondary | ICD-10-CM | POA: Diagnosis not present

## 2024-03-17 DIAGNOSIS — E1165 Type 2 diabetes mellitus with hyperglycemia: Secondary | ICD-10-CM | POA: Diagnosis not present

## 2024-03-17 DIAGNOSIS — R739 Hyperglycemia, unspecified: Secondary | ICD-10-CM | POA: Diagnosis not present

## 2024-03-17 DIAGNOSIS — R5383 Other fatigue: Secondary | ICD-10-CM | POA: Diagnosis not present

## 2024-03-17 DIAGNOSIS — D72829 Elevated white blood cell count, unspecified: Secondary | ICD-10-CM | POA: Diagnosis not present

## 2024-03-17 DIAGNOSIS — Z1329 Encounter for screening for other suspected endocrine disorder: Secondary | ICD-10-CM | POA: Diagnosis not present

## 2024-03-20 DIAGNOSIS — H26491 Other secondary cataract, right eye: Secondary | ICD-10-CM | POA: Diagnosis not present

## 2024-03-20 DIAGNOSIS — T85398A Other mechanical complication of other ocular prosthetic devices, implants and grafts, initial encounter: Secondary | ICD-10-CM | POA: Diagnosis not present

## 2024-03-20 DIAGNOSIS — H35351 Cystoid macular degeneration, right eye: Secondary | ICD-10-CM | POA: Diagnosis not present

## 2024-03-20 DIAGNOSIS — Z9889 Other specified postprocedural states: Secondary | ICD-10-CM | POA: Diagnosis not present

## 2024-03-24 DIAGNOSIS — Z0001 Encounter for general adult medical examination with abnormal findings: Secondary | ICD-10-CM | POA: Diagnosis not present

## 2024-03-24 DIAGNOSIS — Z6835 Body mass index (BMI) 35.0-35.9, adult: Secondary | ICD-10-CM | POA: Diagnosis not present

## 2024-03-24 DIAGNOSIS — H6121 Impacted cerumen, right ear: Secondary | ICD-10-CM | POA: Diagnosis not present

## 2024-03-24 DIAGNOSIS — I480 Paroxysmal atrial fibrillation: Secondary | ICD-10-CM | POA: Diagnosis not present

## 2024-03-24 DIAGNOSIS — Z23 Encounter for immunization: Secondary | ICD-10-CM | POA: Diagnosis not present

## 2024-03-24 DIAGNOSIS — H6123 Impacted cerumen, bilateral: Secondary | ICD-10-CM | POA: Diagnosis not present

## 2024-03-24 DIAGNOSIS — E1165 Type 2 diabetes mellitus with hyperglycemia: Secondary | ICD-10-CM | POA: Diagnosis not present

## 2024-03-24 DIAGNOSIS — G4733 Obstructive sleep apnea (adult) (pediatric): Secondary | ICD-10-CM | POA: Diagnosis not present

## 2024-03-24 DIAGNOSIS — H6122 Impacted cerumen, left ear: Secondary | ICD-10-CM | POA: Diagnosis not present

## 2024-04-10 DIAGNOSIS — H26491 Other secondary cataract, right eye: Secondary | ICD-10-CM | POA: Diagnosis not present

## 2024-04-10 DIAGNOSIS — T85398A Other mechanical complication of other ocular prosthetic devices, implants and grafts, initial encounter: Secondary | ICD-10-CM | POA: Diagnosis not present

## 2024-04-10 DIAGNOSIS — Z9889 Other specified postprocedural states: Secondary | ICD-10-CM | POA: Diagnosis not present

## 2024-04-10 DIAGNOSIS — H35351 Cystoid macular degeneration, right eye: Secondary | ICD-10-CM | POA: Diagnosis not present

## 2024-04-19 DIAGNOSIS — Z6835 Body mass index (BMI) 35.0-35.9, adult: Secondary | ICD-10-CM | POA: Diagnosis not present

## 2024-04-19 DIAGNOSIS — E1165 Type 2 diabetes mellitus with hyperglycemia: Secondary | ICD-10-CM | POA: Diagnosis not present

## 2024-04-19 DIAGNOSIS — I480 Paroxysmal atrial fibrillation: Secondary | ICD-10-CM | POA: Diagnosis not present

## 2024-04-19 DIAGNOSIS — M62830 Muscle spasm of back: Secondary | ICD-10-CM | POA: Diagnosis not present

## 2024-08-07 ENCOUNTER — Ambulatory Visit: Admitting: Cardiology
